# Patient Record
Sex: Female | Born: 1985 | Race: White | Hispanic: No | Marital: Single | Smoking: Never smoker
Health system: Southern US, Community
[De-identification: ages and names within clinical notes are randomized; demographics above are authoritative.]

## PROBLEM LIST (undated history)

## (undated) DIAGNOSIS — D249 Benign neoplasm of unspecified breast: Secondary | ICD-10-CM

## (undated) DIAGNOSIS — G540 Brachial plexus disorders: Secondary | ICD-10-CM

## (undated) DIAGNOSIS — G43909 Migraine, unspecified, not intractable, without status migrainosus: Secondary | ICD-10-CM

## (undated) DIAGNOSIS — K219 Gastro-esophageal reflux disease without esophagitis: Secondary | ICD-10-CM

## (undated) DIAGNOSIS — Z973 Presence of spectacles and contact lenses: Secondary | ICD-10-CM

## (undated) DIAGNOSIS — D259 Leiomyoma of uterus, unspecified: Secondary | ICD-10-CM

## (undated) DIAGNOSIS — R102 Pelvic and perineal pain: Secondary | ICD-10-CM

## (undated) DIAGNOSIS — H43811 Vitreous degeneration, right eye: Secondary | ICD-10-CM

## (undated) HISTORY — DX: Benign neoplasm of unspecified breast: D24.9

## (undated) HISTORY — DX: Gastro-esophageal reflux disease without esophagitis: K21.9

---
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
## 2014-05-26 ENCOUNTER — Other Ambulatory Visit: Payer: Self-pay | Admitting: Family Medicine

## 2014-05-26 ENCOUNTER — Ambulatory Visit
Admission: RE | Admit: 2014-05-26 | Discharge: 2014-05-26 | Disposition: A | Discharge status: Home / Self Care | Payer: BC Managed Care – PPO | Source: Ambulatory Visit | Attending: Family Medicine | Admitting: Family Medicine

## 2014-05-26 DIAGNOSIS — R0602 Shortness of breath: Secondary | ICD-10-CM

## 2014-11-10 DIAGNOSIS — H43811 Vitreous degeneration, right eye: Secondary | ICD-10-CM

## 2014-11-10 HISTORY — DX: Vitreous degeneration, right eye: H43.811

## 2015-11-11 HISTORY — PX: ESOPHAGOGASTRODUODENOSCOPY (EGD) WITH PROPOFOL: SHX5813

## 2015-11-20 ENCOUNTER — Other Ambulatory Visit: Payer: Self-pay | Admitting: Gastroenterology

## 2015-11-20 DIAGNOSIS — R1013 Epigastric pain: Secondary | ICD-10-CM

## 2015-11-27 ENCOUNTER — Ambulatory Visit
Admission: RE | Admit: 2015-11-27 | Discharge: 2015-11-27 | Disposition: A | Discharge status: Home / Self Care | Payer: BLUE CROSS/BLUE SHIELD | Source: Ambulatory Visit | Attending: Gastroenterology | Admitting: Gastroenterology

## 2015-11-27 ENCOUNTER — Encounter (INDEPENDENT_AMBULATORY_CARE_PROVIDER_SITE_OTHER): Payer: Self-pay

## 2015-11-27 DIAGNOSIS — R1013 Epigastric pain: Secondary | ICD-10-CM

## 2016-03-06 DIAGNOSIS — Z01419 Encounter for gynecological examination (general) (routine) without abnormal findings: Secondary | ICD-10-CM | POA: Diagnosis not present

## 2016-03-06 DIAGNOSIS — Z6822 Body mass index (BMI) 22.0-22.9, adult: Secondary | ICD-10-CM | POA: Diagnosis not present

## 2016-03-17 ENCOUNTER — Ambulatory Visit: Payer: BLUE CROSS/BLUE SHIELD | Admitting: Neurology

## 2016-03-25 ENCOUNTER — Encounter: Payer: Self-pay | Admitting: Neurology

## 2016-03-25 ENCOUNTER — Ambulatory Visit (INDEPENDENT_AMBULATORY_CARE_PROVIDER_SITE_OTHER): Payer: BLUE CROSS/BLUE SHIELD | Admitting: Neurology

## 2016-03-25 VITALS — BP 121/74 | HR 71 | Ht 62.0 in | Wt 130.6 lb

## 2016-03-25 DIAGNOSIS — G43009 Migraine without aura, not intractable, without status migrainosus: Secondary | ICD-10-CM

## 2016-03-25 MED ORDER — SUMATRIPTAN SUCCINATE 100 MG PO TABS: 100.0000 mg | ORAL_TABLET | Freq: Once | ORAL | Status: DC | PRN

## 2016-03-25 NOTE — Patient Instructions (Addendum)
Remember to drink plenty of fluid, eat healthy meals and do not skip any meals. Try to eat protein with a every meal and eat a healthy snack such as fruit or nuts in between meals. Try to keep a regular sleep-wake schedule and try to exercise daily, particularly in the form of walking, 20-30 minutes a day, if you can.   As far as your medications are concerned, I would like to suggest:  Imitrex: Please take one tablet at the onset of your headache. If it does not improve the symptoms please take one additional tablet in 2 hours. Do not take more then 2 tablets in 24hrs. Do not take use more then 2 to 3 times in a week.  As far as diagnostic testing: labs  Our phone number is [phone]. We also have an after hours call service for urgent matters and there is a physician on-call for urgent questions. For any emergencies you know to call 911 or go to the nearest emergency room

## 2016-03-25 NOTE — Progress Notes (Signed)
[facility]    Provider:  Dr Jaynee Eagles Referring Provider: Arvella Nigh, MD Primary Care Physician:  Vena Austria, MD  CC:  Headaches  HPI:  [name] is a 30 y.o. female here as a referral from Dr. Radene Knee for headaches. Past medical history of asthma, Headaches started for most of her life since high school. Worsening over the last 5-6 years. They are more intense, has to lay down. Usually around the eyes, mostly around the temples. Usually more unilateral then switches to the other side. Throbbing behind the eyes. Light sensitivity, shuts the blinds, lays down with an ice pack. +nausea but no vomiting. She has them on the weekends. 10 headache days a month.  2-3 are migraines. Migraines last the whole day. Gradually gets stronger over a few hours, can get to a 7/10 in pain at its worst on average 3-5/10. Last all day, up to 24 hours. No aura. No dizziness or vertigo or weakness or sensory changes. Mother, aunt, grandmother with migraines. No plans to get pregnant. No medication overuse. Takes Excedrin occ. Or Tylenol but no more than a few times a week. The non-migrainous headaches last just a few hours and they are dull and not too painful.  No blurry vision. No aura. No family history of headaches.  Reviewed notes, labs and imaging from outside physicians, which showed: Reviewed notes from OB/GYN. She was started on birth control pills for management of abnormal bleeding, her regulars were regular but she was having midcycle spotting. No significant dysmenorrhea. Reported she had problems on and up with headaches for some time that it never been evaluated. Not sexually active. No urinary or bowel complaints. Exam was negative, thyroid nonpalpable, breasts normal, lungs clear, regular rate and rhythm without murmurs or gallop, no carotid or abdominal bruits, abdominal exam benign, no masses or organomegaly or tenderness, normal external genitalia, cervix unremarkable,  uterus and adnexa normal.  Review of Systems: Patient complains of symptoms per HPI as well as the following symptoms: No reported cardiac problems, no chest pain or shortness of breath, no fevers or systemic signs. Pertinent negatives per HPI. All others negative.   Social History   Social History  . Marital Status: Single    Spouse Name: N/A  . Number of Children: N/A  . Years of Education: N/A   Occupational History  . Not on file.   Social History Main Topics  . Smoking status: Not on file  . Smokeless tobacco: Not on file  . Alcohol Use: Not on file  . Drug Use: Not on file  . Sexual Activity: Not on file   Other Topics Concern  . Not on file   Social History Narrative  . No narrative on file    No family history on file.  No past medical history on file.  No past surgical history on file.  Current Outpatient Prescriptions  Medication Sig Dispense Refill  . CRYSELLE-28 0.3-30 MG-MCG tablet Take 1 tablet by mouth daily.  0  . omeprazole (PRILOSEC) 20 MG capsule Take 20 mg by mouth 2 (two) times daily.  12   No current facility-administered medications for this visit.    Allergies as of 03/25/2016 - Review Complete 03/25/2016  Allergen Reaction Noted  . Meloxicam  03/25/2016  . Sulfa antibiotics Hives 03/25/2016    Vitals: BP 121/74 mmHg  Pulse 71  Ht 5\' 2"  (1.575 m)  Wt 130 lb 9.6 oz (59.24 kg)  BMI 23.88 kg/m2 Last Weight:  Wt Readings  from Last 1 Encounters:  03/25/16 130 lb 9.6 oz (59.24 kg)   Last Height:   Ht Readings from Last 1 Encounters:  03/25/16 5\' 2"  (1.575 m)   Physical exam: Exam: Gen: NAD, conversant, well nourised,  well groomed                     CV: RRR, no MRG. No Carotid Bruits. No peripheral edema, warm, nontender Eyes: Conjunctivae clear without exudates or hemorrhage  Neuro: Detailed Neurologic Exam  Speech:    Speech is normal; fluent and spontaneous with normal comprehension.  Cognition:    The patient is  oriented to person, place, and time;     recent and remote memory intact;     language fluent;     normal attention, concentration,     fund of knowledge Cranial Nerves:    The pupils are equal, round, and reactive to light. The fundi are normal and spontaneous venous pulsations are present. Visual fields are full to finger confrontation. Extraocular movements are intact. Trigeminal sensation is intact and the muscles of mastication are normal. The face is symmetric. The palate elevates in the midline. Hearing intact. Voice is normal. Shoulder shrug is normal. The tongue has normal motion without fasciculations.   Coordination:    Normal finger to nose and heel to shin. Normal rapid alternating movements.   Gait:    Heel-toe and tandem gait are normal.   Motor Observation:    No asymmetry, no atrophy, and no involuntary movements noted. Tone:    Normal muscle tone.    Posture:    Posture is normal. normal erect    Strength:    Strength is V/V in the upper and lower limbs.      Sensation: intact to LT     Reflex Exam:  DTR's:    Deep tendon reflexes in the upper and lower extremities are normal bilaterally.   Toes:    The toes are downgoing bilaterally.   Clonus:    Clonus is absent.       Assessment/Plan:  30 year old female with migraines without aura without status migrainosus not intractable. Discussed preventative and acute management of migraines. Discussed lifestyle adjustments and watching for triggers, keeping a headache diary. Patient does not want to go on preventative medication at this time, can continue with just acute management.  As far as your medications are concerned, I would like to suggest:  Imitrex: Please take one tablet at the onset of your headache. If it does not improve the symptoms please take one additional tablet in 2 hours. Do not take more then 2 tablets in 24hrs. Do not take use more then 2 to 3 times in a week.The most common side-effects  are feeling sick (nausea), dizziness and dry mouth. In addition, triptans can also cause some people to experience strange sensations. These may be a tightness, tingling, flushing, and feelings of heaviness or pressure in areas such as the face and limbs, and occasionally the chest. Serious side effects can include stroke, cardiac side effects such as chest tightness, shortness of breath and possible cardiovascular adverse effects. Do not take his medication if you are pregnant.  As far as diagnostic testing: CBC and CMP.  To prevent or relieve headaches, try the following: Cool Compress. Lie down and place a cool compress on your head.  Avoid headache triggers. If certain foods or odors seem to have triggered your migraines in the past, avoid them. A headache diary might  help you identify triggers.  Include physical activity in your daily routine. Try a daily walk or other moderate aerobic exercise.  Manage stress. Find healthy ways to cope with the stressors, such as delegating tasks on your to-do list.  Practice relaxation techniques. Try deep breathing, yoga, massage and visualization.  Eat regularly. Eating regularly scheduled meals and maintaining a healthy diet might help prevent headaches. Also, drink plenty of fluids.  Follow a regular sleep schedule. Sleep deprivation might contribute to headaches Consider biofeedback. With this mind-body technique, you learn to control certain bodily functions - such as muscle tension, heart rate and blood pressure - to prevent headaches or reduce headache pain.    Proceed to emergency room if you experience new or worsening symptoms or symptoms do not resolve, if you have new neurologic symptoms or if headache is severe, or for any concerning symptom.       Sarina Ill, MD  [facility] [address] [place], [place] [address]  Phone [phone] Fax [phone]

## 2016-03-26 ENCOUNTER — Telehealth: Payer: Self-pay | Admitting: *Deleted

## 2016-03-26 DIAGNOSIS — G43909 Migraine, unspecified, not intractable, without status migrainosus: Secondary | ICD-10-CM | POA: Insufficient documentation

## 2016-03-26 LAB — CBC
Hematocrit: 36.5 % (ref 34.0–46.6)
Hemoglobin: 12.2 g/dL (ref 11.1–15.9)
MCH: 27.5 pg (ref 26.6–33.0)
MCHC: 33.4 g/dL (ref 31.5–35.7)
MCV: 82 fL (ref 79–97)
PLATELETS: 277 10*3/uL (ref 150–379)
RBC: 4.43 x10E6/uL (ref 3.77–5.28)
RDW: 14 % (ref 12.3–15.4)
WBC: 3.6 10*3/uL (ref 3.4–10.8)

## 2016-03-26 LAB — COMPREHENSIVE METABOLIC PANEL
ALK PHOS: 52 IU/L (ref 39–117)
ALT: 11 IU/L (ref 0–32)
AST: 14 IU/L (ref 0–40)
Albumin/Globulin Ratio: 1.4 (ref 1.2–2.2)
Albumin: 4.2 g/dL (ref 3.5–5.5)
BILIRUBIN TOTAL: 0.6 mg/dL (ref 0.0–1.2)
BUN/Creatinine Ratio: 15 (ref 9–23)
BUN: 8 mg/dL (ref 6–20)
CO2: 21 mmol/L (ref 18–29)
Calcium: 9.4 mg/dL (ref 8.7–10.2)
Chloride: 103 mmol/L (ref 96–106)
Creatinine, Ser: 0.53 mg/dL — ABNORMAL LOW (ref 0.57–1.00)
GFR calc Af Amer: 147 mL/min/{1.73_m2} (ref >59)
GFR calc non Af Amer: 128 mL/min/{1.73_m2} (ref >59)
GLOBULIN, TOTAL: 3 g/dL (ref 1.5–4.5)
Glucose: 98 mg/dL (ref 65–99)
POTASSIUM: 5 mmol/L (ref 3.5–5.2)
SODIUM: 140 mmol/L (ref 134–144)
Total Protein: 7.2 g/dL (ref 6.0–8.5)

## 2016-03-26 NOTE — Telephone Encounter (Signed)
LVM about normal labs per Dr Jaynee Eagles. Gave [facility] phone number if she has further questions.

## 2016-03-26 NOTE — Telephone Encounter (Signed)
-----   Message from Melvenia Beam, MD sent at 03/26/2016 12:10 PM EDT ----- Labs normal

## 2016-05-26 DIAGNOSIS — G43909 Migraine, unspecified, not intractable, without status migrainosus: Secondary | ICD-10-CM | POA: Diagnosis not present

## 2016-05-27 ENCOUNTER — Encounter: Payer: Self-pay | Admitting: Neurology

## 2016-06-05 DIAGNOSIS — K219 Gastro-esophageal reflux disease without esophagitis: Secondary | ICD-10-CM | POA: Diagnosis not present

## 2016-07-09 ENCOUNTER — Encounter: Payer: Self-pay | Admitting: Neurology

## 2016-07-09 ENCOUNTER — Telehealth: Payer: Self-pay | Admitting: Neurology

## 2016-07-09 ENCOUNTER — Encounter: Payer: Self-pay | Admitting: *Deleted

## 2016-07-09 ENCOUNTER — Ambulatory Visit (INDEPENDENT_AMBULATORY_CARE_PROVIDER_SITE_OTHER): Payer: BLUE CROSS/BLUE SHIELD | Admitting: Neurology

## 2016-07-09 VITALS — BP 132/67 | HR 101 | Ht 62.0 in | Wt 132.0 lb

## 2016-07-09 DIAGNOSIS — G43009 Migraine without aura, not intractable, without status migrainosus: Secondary | ICD-10-CM | POA: Diagnosis not present

## 2016-07-09 DIAGNOSIS — R110 Nausea: Secondary | ICD-10-CM | POA: Diagnosis not present

## 2016-07-09 MED ORDER — ELETRIPTAN HYDROBROMIDE 40 MG PO TABS: 40.0000 mg | ORAL_TABLET | ORAL | 12 refills | Status: DC | PRN

## 2016-07-09 MED ORDER — DICLOFENAC POTASSIUM(MIGRAINE) 50 MG PO PACK: 50.0000 mg | PACK | Freq: Once | ORAL | 30 refills | Status: DC | PRN

## 2016-07-09 MED ORDER — NORTRIPTYLINE HCL 10 MG PO CAPS: 20.0000 mg | ORAL_CAPSULE | Freq: Every day | ORAL | 12 refills | Status: DC

## 2016-07-09 MED ORDER — ONDANSETRON 4 MG PO TBDP: ORAL_TABLET | ORAL | 12 refills | Status: DC

## 2016-07-09 NOTE — Progress Notes (Signed)
Faxed [facility] enrollment form to [place]. Fax: [phone]. Received confirmation.

## 2016-07-09 NOTE — Patient Instructions (Addendum)
Remember to drink plenty of fluid, eat healthy meals and do not skip any meals. Try to eat protein with a every meal and eat a healthy snack such as fruit or nuts in between meals. Try to keep a regular sleep-wake schedule and try to exercise daily, particularly in the form of walking, 20-30 minutes a day, if you can.   As far as your medications are concerned, I would like to suggest: Nortriptyline 10mg  at night may increase to 20mg  in 4-6 weeks Relpax: Please take one tablet at the onset of your headache. If it does not improve the symptoms please take one additional tablet in 2 hours. Do not take more then 2 tablets in 24hrs. Do not take use more then 2 to 3 days in a week. May take with Cambia or Zofran.  I would like to see you back in 4 months, sooner if we need to. Please call us with any interim questions, concerns, problems, updates or refill requests.    Our phone number is [phone]. We also have an after hours call service for urgent matters and there is a physician on-call for urgent questions. For any emergencies you know to call 911 or go to the nearest emergency room  Nortriptyline capsules What is this medicine? NORTRIPTYLINE (nor TRIP ti leen) is used to treat depression. This medicine may be used for other purposes; ask your health care provider or pharmacist if you have questions. What should I tell my health care provider before I take this medicine? They need to know if you have any of these conditions: -an alcohol problem -bipolar disorder or schizophrenia -difficulty passing urine, prostate trouble -glaucoma -heart disease or recent heart attack -liver disease -over active thyroid -seizures -thoughts or plans of suicide or a previous suicide attempt or family history of suicide attempt -an unusual or allergic reaction to nortriptyline, other medicines, foods, dyes, or preservatives -pregnant or trying to get pregnant -breast-feeding How should I use this  medicine? Take this medicine by mouth with a glass of water. Follow the directions on the prescription label. Take your doses at regular intervals. Do not take it more often than directed. Do not stop taking this medicine suddenly except upon the advice of your doctor. Stopping this medicine too quickly may cause serious side effects or your condition may worsen. A special MedGuide will be given to you by the pharmacist with each prescription and refill. Be sure to read this information carefully each time. Talk to your pediatrician regarding the use of this medicine in children. Special care may be needed. Overdosage: If you think you have taken too much of this medicine contact a poison control center or emergency room at once. NOTE: This medicine is only for you. Do not share this medicine with others. What if I miss a dose? If you miss a dose, take it as soon as you can. If it is almost time for your next dose, take only that dose. Do not take double or extra doses. What may interact with this medicine? Do not take this medicine with any of the following medications: -arsenic trioxide -certain medicines medicines for irregular heart beat -cisapride -halofantrine -linezolid -MAOIs like Carbex, Eldepryl, Marplan, Nardil, and Parnate -methylene blue (injected into a vein) -other medicines for mental depression -phenothiazines like perphenazine, thioridazine and chlorpromazine -pimozide -probucol -procarbazine -sparfloxacin -St. John's Wort -ziprasidone This medicine may also interact with any of the following medications: -atropine and related drugs like hyoscyamine, scopolamine, tolterodine and others -barbiturate  medicines for inducing sleep or treating seizures, such as phenobarbital -cimetidine -medicines for diabetes -medicines for seizures like carbamazepine or phenytoin -reserpine -thyroid medicine This list may not describe all possible interactions. Give your health care  provider a list of all the medicines, herbs, non-prescription drugs, or dietary supplements you use. Also tell them if you smoke, drink alcohol, or use illegal drugs. Some items may interact with your medicine. What should I watch for while using this medicine? Tell your doctor if your symptoms do not get better or if they get worse. Visit your doctor or health care professional for regular checks on your progress. Because it may take several weeks to see the full effects of this medicine, it is important to continue your treatment as prescribed by your doctor. Patients and their families should watch out for new or worsening thoughts of suicide or depression. Also watch out for sudden changes in feelings such as feeling anxious, agitated, panicky, irritable, hostile, aggressive, impulsive, severely restless, overly excited and hyperactive, or not being able to sleep. If this happens, especially at the beginning of treatment or after a change in dose, call your health care professional. Dennis Bast may get drowsy or dizzy. Do not drive, use machinery, or do anything that needs mental alertness until you know how this medicine affects you. Do not stand or sit up quickly, especially if you are an older patient. This reduces the risk of dizzy or fainting spells. Alcohol may interfere with the effect of this medicine. Avoid alcoholic drinks. Do not treat yourself for coughs, colds, or allergies without asking your doctor or health care professional for advice. Some ingredients can increase possible side effects. Your mouth may get dry. Chewing sugarless gum or sucking hard candy, and drinking plenty of water may help. Contact your doctor if the problem does not go away or is severe. This medicine may cause dry eyes and blurred vision. If you wear contact lenses you may feel some discomfort. Lubricating drops may help. See your eye doctor if the problem does not go away or is severe. This medicine can cause constipation.  Try to have a bowel movement at least every 2 to 3 days. If you do not have a bowel movement for 3 days, call your doctor or health care professional. This medicine can make you more sensitive to the sun. Keep out of the sun. If you cannot avoid being in the sun, wear protective clothing and use sunscreen. Do not use sun lamps or tanning beds/booths. What side effects may I notice from receiving this medicine? Side effects that you should report to your doctor or health care professional as soon as possible: -allergic reactions like skin rash, itching or hives, swelling of the face, lips, or tongue -abnormal production of milk in females -breast enlargement in both males and females -breathing problems -confusion, hallucinations -fever with increased sweating -irregular or fast, pounding heartbeat -muscle stiffness, or spasms -pain or difficulty passing urine, loss of bladder control -seizures -suicidal thoughts or other mood changes -swelling of the testicles -tingling, pain, or numbness in the feet or hands -yellowing of the eyes or skin Side effects that usually do not require medical attention (report to your doctor or health care professional if they continue or are bothersome): -change in sex drive or performance -diarrhea -nausea, vomiting -weight gain or loss This list may not describe all possible side effects. Call your doctor for medical advice about side effects. You may report side effects to FDA at 1-800-FDA-1088.  Where should I keep my medicine? Keep out of the reach of children. Store at room temperature between 15 and 30 degrees C (59 and 86 degrees F). Keep container tightly closed. Throw away any unused medicine after the expiration date. NOTE: This sheet is a summary. It may not cover all possible information. If you have questions about this medicine, talk to your doctor, pharmacist, or health care provider.    2016, [employer] Standard. (2012-03-15  13:57:12)  Diclofenac powder for oral solution What is this medicine? DICLOFENAC (dye KLOE fen ak) is a non-steroidal anti-inflammatory drug (NSAID). It is used to treat migraine pain. This medicine may be used for other purposes; ask your health care provider or pharmacist if you have questions. What should I tell my health care provider before I take this medicine? They need to know if you have any of these conditions: -asthma, especially aspirin sensitive asthma -coronary artery bypass graft (CABG) surgery within the past 2 weeks -drink more than 3 alcohol-containing drinks a day -heart disease or circulation problems like heart failure or leg edema (fluid retention) -high blood pressure -kidney disease -liver disease -phenylketonuria -stomach problems -an unusual or allergic reaction to diclofenac, aspirin, other NSAIDs, other medicines, foods, dyes, or preservatives -pregnant or trying to get pregnant -breast-feeding How should I use this medicine? Mix this medicine with 1 to 2 ounces of water. Drink the medicine and water together. Follow the directions on the prescription label. Do not take your medicine more often than directed. Long-term, continuous use may increase the risk of heart attack or stroke. A special MedGuide will be given to you by the pharmacist with each prescription and refill. Be sure to read this information carefully each time. Talk to your pediatrician regarding the use of this medicine in children. Special care may be needed. Elderly patients over 8 years old may have a stronger reaction and need a smaller dose. Overdosage: If you think you have taken too much of this medicine contact a poison control center or emergency room at once. NOTE: This medicine is only for you. Do not share this medicine with others. What if I miss a dose? This does not apply. What may interact with this medicine? Do not take this medicine with any of the following  medications: -cidofovir -ketorolac -methotrexate This medicine may also interact with the following medications: -alcohol -aspirin and aspirin-like medicines -cyclosporine -diuretics -lithium -medicines for blood pressure -medicines for osteoporosis -medicines that affect platelets -medicines that treat or prevent blood clots like warfarin -NSAIDs, medicines for pain and inflammation, like ibuprofen or naproxen -pemetrexed -steroid medicines like prednisone or cortisone This list may not describe all possible interactions. Give your health care provider a list of all the medicines, herbs, non-prescription drugs, or dietary supplements you use. Also tell them if you smoke, drink alcohol, or use illegal drugs. Some items may interact with your medicine. What should I watch for while using this medicine? Tell your doctor or health care professional if your pain does not get better. Talk to your doctor before taking another medicine for pain. Do not treat yourself. This medicine does not prevent heart attack or stroke. In fact, this medicine may increase the chance of a heart attack or stroke. The chance may increase with longer use of this medicine and in people who have heart disease. If you take aspirin to prevent heart attack or stroke, talk with your doctor or health care professional. Do not take medicines such as ibuprofen and naproxen with  this medicine. Side effects such as stomach upset, nausea, or ulcers may be more likely to occur. Many medicines available without a prescription should not be taken with this medicine. This medicine can cause ulcers and bleeding in the stomach and intestines at any time during treatment. Do not smoke cigarettes or drink alcohol. These increase irritation to your stomach and can make it more susceptible to damage from this medicine. Ulcers and bleeding can happen without warning symptoms and can cause death. You may get drowsy or dizzy. Do not drive, use  machinery, or do anything that needs mental alertness until you know how this medicine affects you. Do not stand or sit up quickly, especially if you are an older patient. This reduces the risk of dizzy or fainting spells. This medicine can cause you to bleed more easily. Try to avoid damage to your teeth and gums when you brush or floss your teeth. If you take migraine medicines for 10 or more days a month, your migraines may get worse. Keep a diary of headache days and medicine use. Contact your healthcare professional if your migraine attacks occur more frequently. What side effects may I notice from receiving this medicine? Side effects that you should report to your doctor or health care professional as soon as possible: -allergic reactions like skin rash, itching or hives, swelling of the face, lips, or tongue -black or bloody stools, blood in the urine or vomit -blurred vision -chest pain -difficulty breathing or wheezing -nausea or vomiting -fever -redness, blistering, peeling or loosening of the skin, including inside the mouth -slurred speech or weakness on one side of the body -trouble passing urine or change in the amount of urine -unexplained weight gain or swelling -unusually weak or tired -yellowing of eyes or skin Side effects that usually do not require medical attention (Report these to your doctor or health care professional if they continue or are bothersome.): -constipation -diarrhea -dizziness -headache -heartburn This list may not describe all possible side effects. Call your doctor for medical advice about side effects. You may report side effects to FDA at 1-800-FDA-1088. Where should I keep my medicine? Keep out of the reach of children. Store at room temperature between 15 and 30 degrees C (59 and 86 degrees F). Throw away any unused medicine after the expiration date. NOTE: This sheet is a summary. It may not cover all possible information. If you have  questions about this medicine, talk to your doctor, pharmacist, or health care provider.    2016, [employer]/Gold Standard. (2013-06-28 10:55:34)  Ondansetron oral dissolving tablet What is this medicine? ONDANSETRON (on DAN se tron) is used to treat nausea and vomiting caused by chemotherapy. It is also used to prevent or treat nausea and vomiting after surgery. This medicine may be used for other purposes; ask your health care provider or pharmacist if you have questions. What should I tell my health care provider before I take this medicine? They need to know if you have any of these conditions: -heart disease -history of irregular heartbeat -liver disease -low levels of magnesium or potassium in the blood -an unusual or allergic reaction to ondansetron, granisetron, other medicines, foods, dyes, or preservatives -pregnant or trying to get pregnant -breast-feeding How should I use this medicine? These tablets are made to dissolve in the mouth. Do not try to push the tablet through the foil backing. With dry hands, peel away the foil backing and gently remove the tablet. Place the tablet in the mouth and  allow it to dissolve, then swallow. While you may take these tablets with water, it is not necessary to do so. Talk to your pediatrician regarding the use of this medicine in children. Special care may be needed. Overdosage: If you think you have taken too much of this medicine contact a poison control center or emergency room at once. NOTE: This medicine is only for you. Do not share this medicine with others. What if I miss a dose? If you miss a dose, take it as soon as you can. If it is almost time for your next dose, take only that dose. Do not take double or extra doses. What may interact with this medicine? Do not take this medicine with any of the following medications: -apomorphine -certain medicines for fungal infections like fluconazole, itraconazole, ketoconazole, posaconazole,  voriconazole -cisapride -dofetilide -dronedarone -pimozide -thioridazine -ziprasidone This medicine may also interact with the following medications: -carbamazepine -certain medicines for depression, anxiety, or psychotic disturbances -fentanyl -linezolid -MAOIs like Carbex, Eldepryl, Marplan, Nardil, and Parnate -methylene blue (injected into a vein) -other medicines that prolong the QT interval (cause an abnormal heart rhythm) -phenytoin -rifampicin -tramadol This list may not describe all possible interactions. Give your health care provider a list of all the medicines, herbs, non-prescription drugs, or dietary supplements you use. Also tell them if you smoke, drink alcohol, or use illegal drugs. Some items may interact with your medicine. What should I watch for while using this medicine? Check with your doctor or health care professional as soon as you can if you have any sign of an allergic reaction. What side effects may I notice from receiving this medicine? Side effects that you should report to your doctor or health care professional as soon as possible: -allergic reactions like skin rash, itching or hives, swelling of the face, lips, or tongue -breathing problems -confusion -dizziness -fast or irregular heartbeat -feeling faint or lightheaded, falls -fever and chills -loss of balance or coordination -seizures -sweating -swelling of the hands and feet -tightness in the chest -tremors -unusually weak or tired Side effects that usually do not require medical attention (report to your doctor or health care professional if they continue or are bothersome): -constipation or diarrhea -headache This list may not describe all possible side effects. Call your doctor for medical advice about side effects. You may report side effects to FDA at 1-800-FDA-1088. Where should I keep my medicine? Keep out of the reach of children. Store between 2 and 30 degrees C (36 and 86  degrees F). Throw away any unused medicine after the expiration date. NOTE: This sheet is a summary. It may not cover all possible information. If you have questions about this medicine, talk to your doctor, pharmacist, or health care provider.    2016, [employer] Standard. (2013-08-03 16:21:52)   Eletriptan tablets What is this medicine? ELETRIPTAN (el ih TRIP tan) is used to treat migraines with or without aura. An aura is a strange feeling or visual disturbance that warns you of an attack. It is not used to prevent migraines. This medicine may be used for other purposes; ask your health care provider or pharmacist if you have questions. What should I tell my health care provider before I take this medicine? They need to know if you have any of these conditions: -bowel disease or colitis -diabetes -family history of heart disease -fast or irregular heart beat -heart or blood vessel disease, angina (chest pain), or previous heart attack -high blood pressure -high cholesterol -history  of stroke, transient ischemic attacks (TIAs or mini-strokes), or intracranial bleeding -kidney or liver disease -overweight -poor circulation -postmenopausal or surgical removal of uterus and ovaries -Raynaud's disease -seizure disorder -an unusual or allergic reaction to eletriptan, other medicines, foods, dyes, or preservatives -pregnant or trying to get pregnant -breast-feeding How should I use this medicine? Take this medicine by mouth with a glass of water. Follow the directions on the prescription label. This medicine is taken at the first symptoms of a migraine. It is not for everyday use. If your migraine headache returns after one dose, you can take another dose as directed. You must leave at least 2 hours between doses, and do not take more than 40 mg as a single dose. Do not take more than 80 mg total in any 24 hour period. If there is no improvement at all after the first dose, do not take  a second dose without talking to your doctor or health care professional. Do not take your medicine more often than directed. Talk to your pediatrician regarding the use of this medicine in children. Special care may be needed. Overdosage: If you think you have taken too much of this medicine contact a poison control center or emergency room at once. NOTE: This medicine is only for you. Do not share this medicine with others. What if I miss a dose? This does not apply; this medicine is not for regular use. What may interact with this medicine? Do not take this medicine with any of the following medications: -amiodarone -amphetamine, dextroamphetamine, or cocaine -aprepitant -certain antibiotics like clarithromycin, erythromycin, troleandomycin -cimetidine -conivaptan -dalfopristin; quinupristin -dihydroergotamine, ergotamine, ergoloid mesylates, methysergide, or ergot-type medication - do not take within 24 hours of taking eletriptan. -diltiazem -feverfew -imatinib -medicines for fungal infections like fluconazole, itraconazole, ketoconazole, and voriconazole -medicines for HIV, AIDS -medicines for mental depression like fluvoxamine and nefazodone -mifepristone -other migraine medicines like almotriptan, sumatriptan, naratriptan, rizatriptan, zolmitriptan - do not take within 24 hours of taking eletriptan. -tryptophan -verapamil This medicine may also interact with the following medications: -medicines for mental depression, anxiety or mood problems This list may not describe all possible interactions. Give your health care provider a list of all the medicines, herbs, non-prescription drugs, or dietary supplements you use. Also tell them if you smoke, drink alcohol, or use illegal drugs. Some items may interact with your medicine. What should I watch for while using this medicine? Only take this medicine for a migraine headache. Take it if you get warning symptoms or at the start of a  migraine attack. It is not for regular use to prevent migraine attacks. You may get drowsy or dizzy. Do not drive, use machinery, or do anything that needs mental alertness until you know how this medicine affects you. To reduce dizzy or fainting spells, do not sit or stand up quickly, especially if you are an older patient. Alcohol can increase drowsiness, dizziness and flushing. Avoid alcoholic drinks. Smoking cigarettes may increase the risk of heart-related side effects from using this medicine. If you take migraine medicines for 10 or more days a month, your migraines may get worse. Keep a diary of headache days and medicine use. Contact your healthcare professional if your migraine attacks occur more frequently. What side effects may I notice from receiving this medicine? Side effects that you should report to your doctor or health care professional as soon as possible: -allergic reactions like skin rash, itching or hives, swelling of the face, lips, or tongue -fast, slow,  or irregular heart beat -increased or decreased blood pressure -seizures -severe stomach pain and cramping, bloody diarrhea -signs and symptoms of a blood clot such as breathing problems; changes in vision; chest pain; severe, sudden headache; pain, swelling, warmth in the leg; trouble speaking; sudden numbness or weakness of the face, arm or leg -tingling, pain, or numbness in the face, hands, or feet Side effects that usually do not require medical attention (report to your doctor or health care professional if they continue or are bothersome): -drowsiness -feeling warm, flushing, or redness of the face -headache -muscle cramps, pain -nausea, vomiting -unusually weak or tired This list may not describe all possible side effects. Call your doctor for medical advice about side effects. You may report side effects to FDA at 1-800-FDA-1088. Where should I keep my medicine? Keep out of the reach of children. Store at room  temperature between 15 and 30 degrees C (59 and 86 degrees F). Throw away any unused medicine after the expiration date. NOTE: This sheet is a summary. It may not cover all possible information. If you have questions about this medicine, talk to your doctor, pharmacist, or health care provider.    2016, [employer] Standard. (2013-06-28 10:22:32)

## 2016-07-09 NOTE — Telephone Encounter (Signed)
FYI-[name] with [employer] is calling to advise that she will get started on the authorization for Cambia for the patient and submitting to insurance today.

## 2016-07-09 NOTE — Progress Notes (Signed)
[facility]   Provider:  Dr Jaynee Eagles Referring Provider: Arvella Nigh, MD Primary Care Physician:  Vena Austria, MD  CC:  Headaches  Interval history 07/09/2016: Imitrex not entirely working and  Makes neck stiff, will try Relpax next. Weekends are worse. In June had 4 headaches days ranging from 4-6/10 in pain. In July she had 4 mild headaches 2-4/10, moderate 2 6-8/10 and had one very severe. They slowly progress but can get bad. She has had 4 in August one 7/10. She feels dehydration makes them worse. She has difficulty sleeping throughout the night. She has tried imitrex and ibuprofen and tylenol. She has a lot nausea and pills make her sick and difficulty swallowing. Discussed starting a daily preventative and also options for acute management. We'll start nortriptyline at night for prevention. We'll provide Relpax, Zofran and can be for acute management. Patient is to see me back in 4 months.   HPI:  [name] is a 30 y.o. female here as a referral from Dr. Radene Knee for headaches. Past medical history of asthma, Headaches started for most of her life since high school. Worsening over the last 5-6 years. They are more intense, has to lay down. Usually around the eyes, mostly around the temples. Usually more unilateral then switches to the other side. Throbbing behind the eyes. Light sensitivity, shuts the blinds, lays down with an ice pack. +nausea but no vomiting. She has them on the weekends. 10 headache days a month.  2-3 are migraines. Migraines last the whole day. Gradually gets stronger over a few hours, can get to a 7/10 in pain at its worst on average 3-5/10. Last all day, up to 24 hours. No aura. No dizziness or vertigo or weakness or sensory changes. Mother, aunt, grandmother with migraines. No plans to get pregnant. No medication overuse. Takes Excedrin occ. Or Tylenol but no more than a few times a week. The non-migrainous headaches last just a few hours  and they are dull and not too painful.  No blurry vision. No aura. No family history of headaches.  Reviewed notes, labs and imaging from outside physicians, which showed: Reviewed notes from OB/GYN. She was started on birth control pills for management of abnormal bleeding, her regulars were regular but she was having midcycle spotting. No significant dysmenorrhea. Reported she had problems on and up with headaches for some time that it never been evaluated. Not sexually active. No urinary or bowel complaints. Exam was negative, thyroid nonpalpable, breasts normal, lungs clear, regular rate and rhythm without murmurs or gallop, no carotid or abdominal bruits, abdominal exam benign, no masses or organomegaly or tenderness, normal external genitalia, cervix unremarkable, uterus and adnexa normal.  Review of Systems: Patient complains of symptoms per HPI as well as the following symptoms: No reported cardiac problems, no chest pain or shortness of breath, no fevers or systemic signs. Pertinent negatives per HPI. All others negative.     Social History   Social History  . Marital status: Single    Spouse name: N/A  . Number of children: 0  . Years of education: 50   Occupational History  . [employer]    Social History Main Topics  . Smoking status: Never Smoker  . Smokeless tobacco: Never Used  . Alcohol use No  . Drug use: No  . Sexual activity: Not on file   Other Topics Concern  . Not on file   Social History Narrative   Lives with mother and stepfather  Caffeine use: 1 coffee/day   1 soda per day    Family History  Problem Relation Age of Onset  . High blood pressure Mother   . High blood pressure Father   . Diabetes Mellitus II Maternal Grandmother   . Diabetes Mellitus I Maternal Grandfather   . Lung cancer Maternal Grandfather   . Stroke Paternal Grandfather   . Stroke Paternal Grandmother     Past Medical History:  Diagnosis Date  . GERD  (gastroesophageal reflux disease)     Past Surgical History:  Procedure Laterality Date  . WISDOM TOOTH EXTRACTION  2004   x4    Current Outpatient Prescriptions  Medication Sig Dispense Refill  . CRYSELLE-28 0.3-30 MG-MCG tablet Take 1 tablet by mouth daily.  0  . doxycycline (VIBRAMYCIN) 100 MG capsule Take 100 mg by mouth 2 (two) times daily as needed.  1  . omeprazole (PRILOSEC) 20 MG capsule Take 20 mg by mouth 2 (two) times daily.  12  . PROAIR HFA 108 (90 Base) MCG/ACT inhaler Take 2 puffs by mouth as needed.  2  . promethazine (PHENERGAN) 25 MG tablet Take 25 mg by mouth every 6 (six) hours as needed for nausea or vomiting.    . SUMAtriptan (IMITREX) 100 MG tablet Take 1 tablet (100 mg total) by mouth once as needed for migraine. May repeat in 2 hours if headache persists or recurs. 10 tablet 11   No current facility-administered medications for this visit.     Allergies as of 07/09/2016 - Review Complete 03/25/2016  Allergen Reaction Noted  . Meloxicam  03/25/2016  . Sulfa antibiotics Hives 03/25/2016    Vitals: BP 132/67 (BP Location: Right Arm, Patient Position: Sitting, Cuff Size: Normal)   Pulse (!) 101   Ht 5\' 2"  (1.575 m)   Wt 132 lb (59.9 kg)   BMI 24.14 kg/m  Last Weight:  Wt Readings from Last 1 Encounters:  07/09/16 132 lb (59.9 kg)   Last Height:   Ht Readings from Last 1 Encounters:  07/09/16 5\' 2"  (1.575 m)   Physical exam: Exam: Gen: NAD, conversant, well nourised,  well groomed                     CV: RRR, no MRG. No Carotid Bruits. No peripheral edema, warm, nontender Eyes: Conjunctivae clear without exudates or hemorrhage  Neuro: Detailed Neurologic Exam  Speech:    Speech is normal; fluent and spontaneous with normal comprehension.  Cognition:    The patient is oriented to person, place, and time;     recent and remote memory intact;     language fluent;     normal attention, concentration,     fund of knowledge Cranial Nerves:     The pupils are equal, round, and reactive to light. The fundi are normal and spontaneous venous pulsations are present. Visual fields are full to finger confrontation. Extraocular movements are intact. Trigeminal sensation is intact and the muscles of mastication are normal. The face is symmetric. The palate elevates in the midline. Hearing intact. Voice is normal. Shoulder shrug is normal. The tongue has normal motion without fasciculations.   Coordination:    Normal finger to nose and heel to shin. Normal rapid alternating movements.   Gait:    Heel-toe and tandem gait are normal.   Motor Observation:    No asymmetry, no atrophy, and no involuntary movements noted. Tone:    Normal muscle tone.    Posture:  Posture is normal. normal erect    Strength:    Strength is V/V in the upper and lower limbs.      Sensation: intact to LT     Reflex Exam:  DTR's:    Deep tendon reflexes in the upper and lower extremities are normal bilaterally.   Toes:    The toes are downgoing bilaterally.   Clonus:    Clonus is absent.       Assessment/Plan:  30 year old female with migraines without aura without status migrainosus not intractable. Discussed preventative and acute management of migraines. Discussed lifestyle adjustments and watching for triggers, keeping a headache diary. Patient does not want to go on preventative medication at this time, can continue with just acute management.  As far as your medications are concerned, I would like to suggest:  Relpax: Please take one tablet at the onset of your headache. If it does not improve the symptoms please take one additional tablet in 2 hours. Do not take more then 2 tablets in 24hrs. Do not take use more then 2 to 3 days in a week.The most common side-effects are feeling sick (nausea), dizziness and dry mouth. In addition, triptans can also cause some people to experience strange sensations. These may be a tightness,  tingling, flushing, and feelings of heaviness or pressure in areas such as the face and limbs, and occasionally the chest. Serious side effects can include stroke, cardiac side effects such as chest tightness, shortness of breath and possible cardiovascular adverse effects. Do not take his medication if you are pregnant.  May take with zofran and [place] Nortriptyline at night  Discussed side effects as per patient instructions. Do not Pregnant his medications as they can cause birth defects.  To prevent or relieve headaches, try the following: Cool Compress. Lie down and place a cool compress on your head.  Avoid headache triggers. If certain foods or odors seem to have triggered your migraines in the past, avoid them. A headache diary might help you identify triggers.  Include physical activity in your daily routine. Try a daily walk or other moderate aerobic exercise.  Manage stress. Find healthy ways to cope with the stressors, such as delegating tasks on your to-do list.  Practice relaxation techniques. Try deep breathing, yoga, massage and visualization.  Eat regularly. Eating regularly scheduled meals and maintaining a healthy diet might help prevent headaches. Also, drink plenty of fluids.  Follow a regular sleep schedule. Sleep deprivation might contribute to headaches Consider biofeedback. With this mind-body technique, you learn to control certain bodily functions - such as muscle tension, heart rate and blood pressure - to prevent headaches or reduce headache pain.    Proceed to emergency room if you experience new or worsening symptoms or symptoms do not resolve, if you have new neurologic symptoms or if headache is severe, or for any concerning symptom.    Sarina Ill, MD  [facility] [address] [place], [place] [address]  Phone [phone] Fax [phone]  A total of 30 minutes was spent face-to-face with this patient. Over half  this time was spent on counseling patient on the migraine diagnosis and different diagnostic and therapeutic options available.

## 2016-07-10 DIAGNOSIS — J029 Acute pharyngitis, unspecified: Secondary | ICD-10-CM | POA: Diagnosis not present

## 2016-07-10 NOTE — Telephone Encounter (Signed)
[place] and spoke with Roney Mans. She verified PA they completed was denied. Our office would have to complete an appeal to get medication covered. Since patient has Pharmacist, community they were able to apply discount card so she could receive medication for 6 refills for 20 dollars.  Advised I received a fax from them that they were not able to reach the patient and needed any other contact information if we have it. Katharine Look stated they faxed that to [place] at 1pm and since, the patient has contacted them and agreed to pay the 20 dollars for medication. They informed patient it is up to our office if we complete appeal or not.

## 2016-07-10 NOTE — Telephone Encounter (Signed)
Let's ee if she likes it and postpone until later thanks

## 2016-08-06 ENCOUNTER — Telehealth: Payer: Self-pay | Admitting: *Deleted

## 2016-08-06 NOTE — Telephone Encounter (Signed)
Called and spoke to [place] at [employer]. She stated pt got rx last month for 20 dollars. She verified she can receive 5 more refills for 20 dollars each d/t copay card. She gets a quantity of 9 per month.

## 2016-08-06 NOTE — Telephone Encounter (Signed)
Patient returned phone call. °

## 2016-08-06 NOTE — Telephone Encounter (Signed)
Called and spoke to pt mother (on [place]). She stated pt still at work.  I advised [place] trying to contact her to schedule delivery for next refill. She stated they got notice that insurance declined coverage. She is not sure if pt will continue to get medication because of this.  I advised back on 8/30 I was advised she could receive 6 refills for 20 dollars each d/t copay card. She verbalized understanding. She would like me to call Avella to verify this again.   She is going to give number [phone] to call and verify information.  Pt called back at time I was speaking with mother. Phone staff notified patient and patient advised she will text mother about information. I informed mother of this as well.

## 2016-08-07 ENCOUNTER — Other Ambulatory Visit: Payer: Self-pay | Admitting: *Deleted

## 2016-08-07 MED ORDER — NORTRIPTYLINE HCL 10 MG PO CAPS: 20.0000 mg | ORAL_CAPSULE | Freq: Every day | ORAL | 3 refills | Status: DC

## 2016-08-07 NOTE — Telephone Encounter (Signed)
Called patient back. Relayed information below. Pt verbalized understanding and has no further questions.

## 2016-08-12 ENCOUNTER — Encounter: Payer: Self-pay | Admitting: Neurology

## 2016-09-15 ENCOUNTER — Encounter: Payer: Self-pay | Admitting: Neurology

## 2016-09-19 ENCOUNTER — Encounter: Payer: Self-pay | Admitting: Neurology

## 2016-09-22 DIAGNOSIS — J029 Acute pharyngitis, unspecified: Secondary | ICD-10-CM | POA: Diagnosis not present

## 2016-09-22 DIAGNOSIS — J358 Other chronic diseases of tonsils and adenoids: Secondary | ICD-10-CM | POA: Diagnosis not present

## 2016-09-26 ENCOUNTER — Emergency Department (HOSPITAL_COMMUNITY)
Admission: EM | Admit: 2016-09-26 | Discharge: 2016-09-27 | Disposition: A | Discharge status: Home / Self Care | Payer: BLUE CROSS/BLUE SHIELD | Attending: Emergency Medicine | Admitting: Emergency Medicine

## 2016-09-26 ENCOUNTER — Encounter (HOSPITAL_COMMUNITY): Payer: Self-pay | Admitting: Emergency Medicine

## 2016-09-26 DIAGNOSIS — H538 Other visual disturbances: Secondary | ICD-10-CM | POA: Insufficient documentation

## 2016-09-26 DIAGNOSIS — H539 Unspecified visual disturbance: Secondary | ICD-10-CM

## 2016-09-26 HISTORY — DX: Vitreous degeneration, right eye: H43.811

## 2016-09-26 MED ORDER — FLUORESCEIN SODIUM 1 MG OP STRP
1.0000 | ORAL_STRIP | Freq: Once | OPHTHALMIC | Status: AC
  Administered 2016-09-26: 1 via OPHTHALMIC
  Filled 2016-09-26: qty 1

## 2016-09-26 MED ORDER — TETRACAINE HCL 0.5 % OP SOLN
1.0000 [drp] | Freq: Once | OPHTHALMIC | Status: AC
  Administered 2016-09-26: 1 [drp] via OPHTHALMIC
  Filled 2016-09-26: qty 2

## 2016-09-26 NOTE — ED Triage Notes (Signed)
Patient reports intermittent " Glare and Floaters" at right eye for several days , denies eye injury , no pain or blurred vision .

## 2016-09-26 NOTE — Discharge Instructions (Signed)
Call Dr. Talbert Forest' office on Monday and tell them you were seen in the ED and need a follow up appointment.

## 2016-09-26 NOTE — ED Provider Notes (Signed)
[facility] DEPT Provider Note   CSN: [id number] Arrival date & time: 09/26/16  2027  By signing my name below, I, Reola Mosher, attest that this documentation has been prepared under the direction and in the presence of [facility], [place].  Electronically Signed: Reola Mosher, ED Scribe. 09/26/16. 9:07 PM.  History   Chief Complaint Chief Complaint  Patient presents with  . Eye Problem   The history is provided by the patient. No language interpreter was used.  Eye Problem   This is a new problem. The current episode started more than 1 week ago. The problem occurs constantly. The problem has been gradually worsening. There is a problem in the right eye. There was no injury mechanism. Associated symptoms include photophobia. Pertinent negatives include no nausea and no vomiting.    HPI Comments: [name] is a 30 y.o. female who presents to the Emergency Department complaining of a gradually worsening visual disturbance described as a "glare" to the right eye onset approximately 2 weeks ago. Pt reports associated photophobia secondary to her current visual disturbance. She notes that was recently dx'd with vitreous detachment of the right eye over one month ago, and had f/u one week ago with no reported complications with this issue from her Optometrist. She has not seen an Opthalmologist. No noted treatments for her symptoms were tried prior to coming into the ED. Her visual issues are exacerbated with exposure to light. Pt wears contact lenses at baseline. She has a h/o migraines and is followed by a Neurologist for this issue. She notes that her typically visual symptoms associated with her migraines are unlike her visual symptoms she is experiencing today. Denies eye pain, loss of vision, numbness, weakness, nausea, vomiting, fever, chills, or any other associated symptoms.   Past Medical History:  Diagnosis Date  . GERD (gastroesophageal reflux disease)   . Vitreous  detachment of right eye    Patient Active Problem List   Diagnosis Date Noted  . Migraines 03/26/2016   Past Surgical History:  Procedure Laterality Date  . WISDOM TOOTH EXTRACTION  2004   x4   OB History    No data available     Home Medications    Prior to Admission medications   Medication Sig Start Date End Date Taking? Authorizing Provider  CRYSELLE-28 0.3-30 MG-MCG tablet Take 1 tablet by mouth daily. 02/25/16  Yes Historical Provider, MD  Diclofenac Potassium 50 MG PACK Take 50 mg by mouth once as needed. Take once daily as needed with headache onset. Please take with food 07/09/16  Yes Melvenia Beam, MD  eletriptan (RELPAX) 40 MG tablet Take 1 tablet (40 mg total) by mouth as needed for migraine or headache. May repeat in 2 hours if headache persists or recurs. 07/09/16  Yes Melvenia Beam, MD  omeprazole (PRILOSEC) 20 MG capsule Take 20 mg by mouth 2 (two) times daily. 03/14/16  Yes Historical Provider, MD  PROAIR HFA 108 (90 Base) MCG/ACT inhaler Take 2 puffs by mouth as needed. 06/16/16  Yes Historical Provider, MD  nortriptyline (PAMELOR) 10 MG capsule Take 2 capsules (20 mg total) by mouth at bedtime. Patient not taking: Reported on 09/26/2016 08/07/16   Melvenia Beam, MD  ondansetron (ZOFRAN ODT) 4 MG disintegrating tablet Take 1-2 tablet (4-8 mg total) by mouth every 8 (eight) hours as needed for nausea or vomiting. Patient not taking: Reported on 09/26/2016 07/09/16   Melvenia Beam, MD   Family History Family History  Problem Relation Age of Onset  . High blood pressure Mother   . High blood pressure Father   . Diabetes Mellitus II Maternal Grandmother   . Diabetes Mellitus I Maternal Grandfather   . Lung cancer Maternal Grandfather   . Stroke Paternal Grandfather   . Stroke Paternal Grandmother    Social History Social History  Substance Use Topics  . Smoking status: Never Smoker  . Smokeless tobacco: Never Used  . Alcohol use No   Allergies   Meloxicam  and Sulfa antibiotics  Review of Systems Review of Systems  Constitutional: Negative for chills and fever.  Eyes: Positive for photophobia and visual disturbance. Negative for pain.  Gastrointestinal: Negative for nausea and vomiting.  All other systems reviewed and are negative.  Physical Exam Updated Vital Signs BP 132/74 (BP Location: Right Arm)   Pulse 82   Temp 98.8 F (37.1 C) (Oral)   Resp 20   LMP 09/15/2016 (Approximate)   SpO2 99%   Physical Exam  Constitutional: She appears well-developed and well-nourished. No distress.  HENT:  Head: Normocephalic and atraumatic.  Eyes: Conjunctivae and EOM are normal. Pupils are equal, round, and reactive to light. Right eye exhibits no discharge and no hordeolum. No foreign body present in the right eye.  Fundoscopic exam:      The right eye shows red reflex.  Slit lamp exam:      The right eye shows no corneal abrasion, no corneal ulcer, no foreign body, no hyphema and no fluorescein uptake.  Neck: Normal range of motion.  Cardiovascular: Normal rate.   Pulmonary/Chest: Effort normal.  Abdominal: She exhibits no distension.  Musculoskeletal: Normal range of motion.  Neurological: She is alert.  Skin: No pallor.  Psychiatric: She has a normal mood and affect. Her behavior is normal.  Nursing note and vitals reviewed.  ED Treatments / Results  DIAGNOSTIC STUDIES: Oxygen Saturation is 100% on RA, normal by my interpretation.   COORDINATION OF CARE: 9:07 PM-Discussed next steps with pt. Pt verbalized understanding and is agreeable with the plan.   Labs (all labs ordered are listed, but only abnormal results are displayed) Labs Reviewed - No data to display  Radiology No results found.  Procedures Procedures   Medications Ordered in ED Medications  tetracaine (PONTOCAINE) 0.5 % ophthalmic solution 1 drop (1 drop Right Eye Given 09/26/16 2206)  fluorescein ophthalmic strip 1 strip (1 strip Right Eye Given 09/26/16  2206)    Initial Impression / Assessment and Plan / ED Course  I have reviewed the triage vital signs and the nursing notes.  Pertinent labs & imaging results that were available during my care of the patient were reviewed by me and considered in my medical decision making (see chart for details).  Clinical Course    Pt si a 30yo female who presents to the ED with ongoing visual issues involving the right eye for two weeks. No significant exam findings. No new neurological symptoms. No blurry vision, nausea, vomiting, fevers, or any causes for significant concerns. VSS. Discussed this case w/ my attending, Dr Christy Gentles, who saw this pt. Pt was advised to f/u with an ophthalmologist promptly for this issue which we will refer too. Further, she was advised if her symptoms do not resolve, and all pathologies of the globe of the eye have been r/o, then pt was further instructed to f/u w/ her Neurologist. Pt is comfortable with above plan and is stable for discharge at this time. All questions were  answered prior to disposition. Strict return precautions for return into the ED were discussed such as new neurological symptoms, worsening of vision, further visual changes, loss of vision, or other cause for immediate concern.   Final Clinical Impressions(s) / ED Diagnoses   Final diagnoses:  Visual changes   New Prescriptions Discharge Medication List as of 09/26/2016 11:57 PM     I personally performed the services described in this documentation, which was scribed in my presence. The recorded information has been reviewed and is accurate.     [place], NP 09/27/16 [place], MD 09/27/16 [phone]

## 2016-09-29 DIAGNOSIS — H16141 Punctate keratitis, right eye: Secondary | ICD-10-CM | POA: Diagnosis not present

## 2016-09-29 DIAGNOSIS — G43109 Migraine with aura, not intractable, without status migrainosus: Secondary | ICD-10-CM | POA: Diagnosis not present

## 2016-10-13 DIAGNOSIS — H16141 Punctate keratitis, right eye: Secondary | ICD-10-CM | POA: Diagnosis not present

## 2016-10-13 DIAGNOSIS — G43109 Migraine with aura, not intractable, without status migrainosus: Secondary | ICD-10-CM | POA: Diagnosis not present

## 2016-11-07 ENCOUNTER — Encounter (HOSPITAL_COMMUNITY): Payer: Self-pay | Admitting: *Deleted

## 2016-11-07 ENCOUNTER — Emergency Department (HOSPITAL_COMMUNITY): Payer: BLUE CROSS/BLUE SHIELD

## 2016-11-07 ENCOUNTER — Telehealth: Payer: Self-pay | Admitting: Diagnostic Neuroimaging

## 2016-11-07 ENCOUNTER — Emergency Department (HOSPITAL_COMMUNITY)
Admission: EM | Admit: 2016-11-07 | Discharge: 2016-11-07 | Disposition: A | Discharge status: Home / Self Care | Payer: BLUE CROSS/BLUE SHIELD | Attending: Emergency Medicine | Admitting: Emergency Medicine

## 2016-11-07 DIAGNOSIS — R519 Headache, unspecified: Secondary | ICD-10-CM

## 2016-11-07 DIAGNOSIS — H16141 Punctate keratitis, right eye: Secondary | ICD-10-CM | POA: Diagnosis not present

## 2016-11-07 DIAGNOSIS — G43109 Migraine with aura, not intractable, without status migrainosus: Secondary | ICD-10-CM | POA: Diagnosis not present

## 2016-11-07 DIAGNOSIS — R51 Headache: Secondary | ICD-10-CM | POA: Diagnosis not present

## 2016-11-07 NOTE — ED Provider Notes (Signed)
[facility] DEPT Provider Note   CSN: [id number] Arrival date & time: 11/07/16  1736  By signing my name below, I, [name], attest that this documentation has been prepared under the direction and in the presence of non-physician practitioner, Debroah Baller, NP. Electronically Signed: Jeanell Rosales, Scribe. 11/07/2016. 7:51 PM.  History   Chief Complaint Chief Complaint  Patient presents with  . Headache   The history is provided by the patient. No language interpreter was used.  Headache   This is a recurrent problem. The current episode started 6 to 12 hours ago. The problem occurs constantly. The problem has been resolved. The pain is located in the right unilateral region. The pain is moderate. The pain does not radiate. Pertinent negatives include no fever. Treatments tried: migraine medication. The treatment provided significant relief.   HPI Comments: [name] is a 30 y.o. female with a PMHx of vitreous detachment in right eye who presents to the Emergency Department complaining of constant moderate right-sided headache that started this morning ago. She states she woke up with non-radiating pain and saw a "bright flashing light on the right side". Her neurologist referred her to her ophthalmologist, and they referred her to the ED for a CT scan. They were concerned because of the facial numbness that is not her usual presentation with a migraine. She took migraine medication with relief, and the pain is currently resolved. She reports associated right-sided facial numbness. She denies any weakness or other complaints. Patient reports that she did miss a dose of her medication that she takes to prevent migraines.     PCP: Laura Austria, MD  Past Medical History:  Diagnosis Date  . GERD (gastroesophageal reflux disease)   . Vitreous detachment of right eye     Patient Active Problem List   Diagnosis Date Noted  . Migraines 03/26/2016    Past Surgical  History:  Procedure Laterality Date  . WISDOM TOOTH EXTRACTION  2004   x4    OB History    No data available       Home Medications    Prior to Admission medications   Medication Sig Start Date End Date Taking? Authorizing Provider  CRYSELLE-28 0.3-30 MG-MCG tablet Take 1 tablet by mouth daily. 02/25/16   Historical Provider, MD  Diclofenac Potassium 50 MG PACK Take 50 mg by mouth once as needed. Take once daily as needed with headache onset. Please take with food 07/09/16   Melvenia Beam, MD  eletriptan (RELPAX) 40 MG tablet Take 1 tablet (40 mg total) by mouth as needed for migraine or headache. May repeat in 2 hours if headache persists or recurs. 07/09/16   Melvenia Beam, MD  nortriptyline (PAMELOR) 10 MG capsule Take 2 capsules (20 mg total) by mouth at bedtime. Patient not taking: Reported on 09/26/2016 08/07/16   Melvenia Beam, MD  omeprazole (PRILOSEC) 20 MG capsule Take 20 mg by mouth 2 (two) times daily. 03/14/16   Historical Provider, MD  ondansetron (ZOFRAN ODT) 4 MG disintegrating tablet Take 1-2 tablet (4-8 mg total) by mouth every 8 (eight) hours as needed for nausea or vomiting. Patient not taking: Reported on 09/26/2016 07/09/16   Melvenia Beam, MD  PROAIR HFA [phone] Base) MCG/ACT inhaler Take 2 puffs by mouth as needed. 06/16/16   Historical Provider, MD    Family History Family History  Problem Relation Age of Onset  . High blood pressure Mother   . High blood pressure Father   .  Diabetes Mellitus II Maternal Grandmother   . Diabetes Mellitus I Maternal Grandfather   . Lung cancer Maternal Grandfather   . Stroke Paternal Grandfather   . Stroke Paternal Grandmother     Social History Social History  Substance Use Topics  . Smoking status: Never Smoker  . Smokeless tobacco: Never Used  . Alcohol use No     Allergies   Meloxicam and Sulfa antibiotics   Review of Systems Review of Systems  Constitutional: Negative for fever.  Eyes: Positive for  visual disturbance.  Neurological: Positive for headaches (Right-sided). Negative for weakness.  All other systems reviewed and are negative.    Physical Exam Updated Vital Signs BP 136/73 (BP Location: Right Arm)   Pulse 72   Temp 98.5 F (36.9 C) (Oral)   Resp 16   SpO2 99%   Physical Exam  Constitutional: She is oriented to person, place, and time. She appears well-developed and well-nourished. No distress.  HENT:  Head: Normocephalic and atraumatic.  Right Ear: Tympanic membrane normal.  Left Ear: Tympanic membrane normal.  Nose: Nose normal.  Mouth/Throat: Uvula is midline, oropharynx is clear and moist and mucous membranes are normal. No posterior oropharyngeal edema or posterior oropharyngeal erythema.  No facial weakness.   Eyes: Conjunctivae and EOM are normal. Pupils are equal, round, and reactive to light. No scleral icterus.  Neck: Normal range of motion. Neck supple.  Cardiovascular: Normal rate and regular rhythm.   Pulses:      Radial pulses are 2+ on the right side, and 2+ on the left side.  Adequate circulation.   Pulmonary/Chest: Effort normal. No respiratory distress. She has no wheezes. She has no rales.  Abdominal: Soft. Bowel sounds are normal. There is no tenderness.  Musculoskeletal: Normal range of motion. She exhibits no edema.  Radial and pedal pulses strong, adequate circulation, good touch sensation.  Neurological: She is alert and oriented to person, place, and time. She has normal strength. No cranial nerve deficit or sensory deficit. She displays a negative Romberg sign. Gait normal.  Reflex Scores:      Bicep reflexes are 2+ on the right side and 2+ on the left side.      Brachioradialis reflexes are 2+ on the right side and 2+ on the left side.      Patellar reflexes are 2+ on the right side and 2+ on the left side. Rapid alternating movements without difficulty.Steady gait, no foot drag. Negative Romberg. Stands on one foot without difficulty.   Skin: Skin is warm and dry.  Psychiatric: She has a normal mood and affect. Her behavior is normal.  Nursing note and vitals reviewed.    ED Treatments / Results  DIAGNOSTIC STUDIES: Oxygen Saturation is 99% on RA, normal by my interpretation.    COORDINATION OF CARE: 7:55 PM- Pt advised of plan for treatment and pt agrees.  Labs (all labs ordered are listed, but only abnormal results are displayed) Labs Reviewed - No data to display   Radiology Ct Head Wo Contrast  Result Date: 11/07/2016 CLINICAL DATA:  Right-sided headache and facial numbness over the last day. EXAM: CT HEAD WITHOUT CONTRAST TECHNIQUE: Contiguous axial images were obtained from the base of the skull through the vertex without intravenous contrast. COMPARISON:  None. FINDINGS: Brain: No evidence of malformation, atrophy, old or acute small or large vessel infarction, mass lesion, hemorrhage, hydrocephalus or extra-axial collection. No evidence of pituitary lesion. Vascular: No vascular calcification.  No hyperdense vessels. Skull: Normal.  No fracture or focal bone lesion. Sinuses/Orbits: Visualized sinuses are clear. No fluid in the middle ears or mastoids. Visualized orbits are normal. Other: None significant IMPRESSION: Normal head CT. Electronically Signed   By: Nelson Chimes M.D.   On: 11/07/2016 20:23    Procedures Procedures (including critical care time)  Medications Ordered in ED Medications - No data to display   Initial Impression / Assessment and Plan / ED Course  I have reviewed the triage vital signs and the nursing notes.  Pertinent imaging results that were available during my care of the patient were reviewed by me and considered in my medical decision making (see chart for details).  Clinical Course   30 y.o. female with right side headache and facial numbness stable for d/c with normal neuro exam and normal CT of head. Patient had evaluation for eyes by her pathobiologist prior to coming to  the ED. Patient's headache has resolved. Return precautions given.   Final Clinical Impressions(s) / ED Diagnoses   Final diagnoses:  Right-sided headache    New Prescriptions New Prescriptions   No medications on file   I personally performed the services described in this documentation, which was scribed in my presence. The recorded information has been reviewed and is accurate.     [place], NP 11/07/16 2042    Fatima Blank, MD 11/08/16 [phone]

## 2016-11-07 NOTE — ED Notes (Signed)
Pt stable, ambulatory, states understanding of discharge instructions 

## 2016-11-07 NOTE — Discharge Instructions (Signed)
Your CT of your head today is normal. Follow up with your PCP or your neurologist if the headache returns. Return here as needed. Continue the medications they prescribe for you.

## 2016-11-07 NOTE — Telephone Encounter (Signed)
I received a page from Dr. Talbert Forest office. This morning patient had transient visual disturbance seeing "glitter on the floor" followed by migraine headache. Patient called ophthalmology office for evaluation and she was concerned this could represent retinal detachment. Eye examination was unremarkable and no primary ophthalmology pathology was found. Patient and her family left eye doctor office. After that, eye doctor office called [place] and I received the page. I called back and spoke with Dr. Talbert Forest, who asked on advice on patient management going forward. Based on description via Dr. Talbert Forest, this sounds like typical migraine phenomenon, although it is unusual for patient in that she has never had this symptom in all her years of migraines.  Normally with new neurologic symptoms in a migraine patient, we recommend MRI brain neuroimaging. Due to being the end of the week and and of the day, routine outpatient MRI scan will not be able to be obtained until later next week.   Therefore I recommend patient get evaluated in the emergency room with possible neurology consultation, who may determine need for further evaluation and testing. Dr. Talbert Forest appreciated the advice and will call patient back and relay the information.    Penni Bombard, MD [id number], [id number] PM Certified in Neurology, Neurophysiology and Neuroimaging  [facility] [address] [place], [place] [address] [phone]

## 2016-11-07 NOTE — ED Triage Notes (Addendum)
Sent to ED via pov, by eye specialist and neurologist, for CT Scan. Pt has hx of Migraines. Woke with migraine this am which became worse throughout the day. Pt did see flashes of light in right peripheral vision. Then went to eye specialist due to hx of detatched retina. Examined- negative for detached retina per pt and after conversation between eye doc and neurologist pt was told to come to ED for CT scan. Migraine has subsided.

## 2016-11-07 NOTE — ED Notes (Signed)
Patient transported to CT 

## 2016-11-09 DIAGNOSIS — G43909 Migraine, unspecified, not intractable, without status migrainosus: Secondary | ICD-10-CM | POA: Diagnosis not present

## 2016-11-10 ENCOUNTER — Telehealth: Payer: Self-pay | Admitting: Diagnostic Neuroimaging

## 2016-11-10 NOTE — Telephone Encounter (Signed)
Pt mother called in due to pt having recurrent severe migraine. Triptan helping but wearing off. Advised to use triptan with OTC meds (ibuprofen or tylenol) also, which she was not trying. They are heading to [facility] urgent care now for possible infusion. Agree with plan. Will forward to Dr. Jaynee Eagles for review later this week. -VRP

## 2016-11-11 NOTE — Telephone Encounter (Signed)
[name], can you call patient and see if she would like to come in for follow up since this episode? I can fit her in at noon or 430 one day this month, thanks

## 2016-11-12 ENCOUNTER — Ambulatory Visit: Payer: BLUE CROSS/BLUE SHIELD | Admitting: Neurology

## 2016-11-12 NOTE — Telephone Encounter (Signed)
Dr Ahern- FYI 

## 2016-11-12 NOTE — Telephone Encounter (Signed)
Called pt. Scheduled f/u for 11/20/15 at 4pm, check in 345pm. Pt verbalized understanding.

## 2016-11-19 ENCOUNTER — Ambulatory Visit: Payer: BLUE CROSS/BLUE SHIELD | Admitting: Neurology

## 2016-11-19 ENCOUNTER — Ambulatory Visit (INDEPENDENT_AMBULATORY_CARE_PROVIDER_SITE_OTHER): Payer: BLUE CROSS/BLUE SHIELD | Admitting: Neurology

## 2016-11-19 ENCOUNTER — Encounter: Payer: Self-pay | Admitting: Neurology

## 2016-11-19 VITALS — BP 126/77 | HR 84 | Ht 62.0 in | Wt 133.6 lb

## 2016-11-19 DIAGNOSIS — G43109 Migraine with aura, not intractable, without status migrainosus: Secondary | ICD-10-CM

## 2016-11-19 DIAGNOSIS — G43909 Migraine, unspecified, not intractable, without status migrainosus: Secondary | ICD-10-CM | POA: Diagnosis not present

## 2016-11-19 MED ORDER — PROPRANOLOL HCL 10 MG PO TABS: 20.0000 mg | ORAL_TABLET | Freq: Two times a day (BID) | ORAL | 6 refills | Status: DC

## 2016-11-19 NOTE — Progress Notes (Signed)
[id number]    Provider:  Dr Jaynee Eagles Referring Provider: Maury Dus, MD Primary Care Physician:  Vena Austria, MD  CC: Headaches  Interval history 11/19/2016:  She has had 2 headaches with vision changes in the right eye and was seen at the ED for severe headache. Relpax helps along with cambia. She has 10 headache days a month and the following number of migraines(below). Mother is here who also has similar headaches. We discussed medications, she is toelrating propranolol well withh slowly increase. Discussed triptan's, Relpax appears to work but may wear off advised her to try Relpax with Cambia and zofran and also discussed Zomig (provided samples of nasal spray) and other triptan's and different ways to administer including nose sprays and powders and injectables. Also discussed Cephaly device and other devices available on the market. Migraines: September 2  October 4 Nov 3 Dec 4    Tried: Topiramate and nortriptyline, now on propranolol  Interval history 07/09/2016: Imitrex not entirely working and  Makes neck stiff, will try Relpax next. Weekends are worse. In June had 4 headaches days ranging from 4-6/10 in pain. In July she had 4 mild headaches 2-4/10, moderate 2 6-8/10 and had one very severe. They slowly progress but can get bad. She has had 4 in August one 7/10. She feels dehydration makes them worse. She has difficulty sleeping throughout the night. She has tried imitrex and ibuprofen and tylenol. She has a lot nausea and pills make her sick and difficulty swallowing. Discussed starting a daily preventative and also options for acute management. We'll start nortriptyline at night for prevention. We'll provide Relpax, Zofran and can be for acute management. Patient is to see me back in 4 months.   [id number] [name] a 31 y.o.femalehere as a referral from Dr. Rhona Leavens headaches. Past medical history of asthma, Headaches started for most of  her life since high school. Worsening over the last 5-6 years. They are more intense, has to lay down. Usually around the eyes, mostly around the temples. Usually more unilateral then switches to the other side. Throbbing behind the eyes. Light sensitivity, shuts the blinds, lays down with an ice pack. +nausea but no vomiting. She has them on the weekends. 10 headache days a month. 2-3 are migraines. Migraines last the whole day. Gradually gets stronger over a few hours, can get to a 7/10 in pain at its worst on average 3-5/10. Last all day, up to 24 hours. No aura. No dizziness or vertigo or weakness or sensory changes. Mother, aunt, grandmother with migraines. No plans to get pregnant. No medication overuse. Takes Excedrin occ. Or Tylenol but no more than a few times a week. The non-migrainous headaches last just a few hours and they are dull and not too painful. No blurry vision. No aura. No family history of headaches.  Reviewed notes, labs and imaging from outside physicians, which showed: Reviewed notes from OB/GYN. She was started on birth control pills for management of abnormal bleeding, her regulars were regular but she was having midcycle spotting. No significant dysmenorrhea. Reported she had problems on and up with headaches for some time that it never been evaluated. Not sexually active. No urinary or bowel complaints. Exam was negative, thyroid nonpalpable, breasts normal, lungs clear, regular rate and rhythm without murmurs or gallop, no carotid or abdominal bruits, abdominal exam benign, no masses or organomegaly or tenderness, normal external genitalia, cervix unremarkable, uterus and adnexa normal.  Review of Systems: Patient complains of symptoms per HPI  as well as the following symptoms: No reported cardiac problems, no chest pain or shortness of breath, no fevers or systemic signs. Pertinent negatives per HPI. All others negative.   Social History   Social History  . Marital  status: Single    Spouse name: N/A  . Number of children: 0  . Years of education: 39   Occupational History  . [employer]    Social History Main Topics  . Smoking status: Never Smoker  . Smokeless tobacco: Never Used  . Alcohol use No  . Drug use: No  . Sexual activity: Not on file   Other Topics Concern  . Not on file   Social History Narrative   Lives with mother and stepfather   Caffeine use: 1 coffee/day   1 soda per day    Family History  Problem Relation Age of Onset  . High blood pressure Mother   . High blood pressure Father   . Diabetes Mellitus II Maternal Grandmother   . Diabetes Mellitus I Maternal Grandfather   . Lung cancer Maternal Grandfather   . Stroke Paternal Grandfather   . Stroke Paternal Grandmother     Past Medical History:  Diagnosis Date  . GERD (gastroesophageal reflux disease)   . Vitreous detachment of right eye     Past Surgical History:  Procedure Laterality Date  . WISDOM TOOTH EXTRACTION  2004   x4    Current Outpatient Prescriptions  Medication Sig Dispense Refill  . CRYSELLE-28 0.3-30 MG-MCG tablet Take 1 tablet by mouth daily.  0  . Diclofenac Potassium 50 MG PACK Take 50 mg by mouth once as needed. Take once daily as needed with headache onset. Please take with food 30 each 30  . eletriptan (RELPAX) 40 MG tablet Take 1 tablet (40 mg total) by mouth as needed for migraine or headache. May repeat in 2 hours if headache persists or recurs. 9 tablet 12  . omeprazole (PRILOSEC) 20 MG capsule Take 20 mg by mouth 2 (two) times daily.  12  . ondansetron (ZOFRAN ODT) 4 MG disintegrating tablet Take 1-2 tablet (4-8 mg total) by mouth every 8 (eight) hours as needed for nausea or vomiting. 20 tablet 12  . PROAIR HFA 108 (90 Base) MCG/ACT inhaler Take 2 puffs by mouth as needed.  2  . propranolol (INDERAL) 10 MG tablet Take 10 mg by mouth daily.  0   No current facility-administered medications for this visit.      Allergies as of 11/19/2016 - Review Complete 11/19/2016  Allergen Reaction Noted  . Meloxicam  03/25/2016  . Sulfa antibiotics Hives 03/25/2016    Vitals: BP 126/77 (BP Location: Right Arm, Patient Position: Sitting, Cuff Size: Normal)   Pulse 84   Ht 5\' 2"  (1.575 m)   Wt 133 lb 9.6 oz (60.6 kg)   BMI 24.44 kg/m  Last Weight:  Wt Readings from Last 1 Encounters:  11/19/16 133 lb 9.6 oz (60.6 kg)   Last Height:   Ht Readings from Last 1 Encounters:  11/19/16 5\' 2"  (1.575 m)   Physical exam: Exam: Gen: NAD, conversant, well nourised, well groomed  CV: RRR, no MRG. No Carotid Bruits. No peripheral edema, warm, nontender Eyes: Conjunctivae clear without exudates or hemorrhage  Neuro: Detailed Neurologic Exam  Speech: Speech is normal; fluent and spontaneous with normal comprehension.  Cognition: The patient is oriented to person, place, and time;  recent and remote memory intact;  language fluent;  normal attention, concentration,  fund of knowledge Cranial Nerves: The pupils are equal, round, and reactive to light. The fundi are normal and spontaneous venous pulsations are present. Visual fields are full to finger confrontation. Extraocular movements are intact. Trigeminal sensation is intact and the muscles of mastication are normal. The face is symmetric. The palate elevates in the midline. Hearing intact. Voice is normal. Shoulder shrug is normal. The tongue has normal motion without fasciculations.   Coordination: Normal finger to nose and heel to shin. Normal rapid alternating movements.   Gait: Heel-toe and tandem gait are normal.   Motor Observation: No asymmetry, no atrophy, and no involuntary movements noted. Tone: Normal muscle tone.   Posture: Posture is normal. normal erect  Strength: Strength is V/V in the upper and lower limbs.   Sensation: intact to LT  Reflex  Exam:  DTR's: Deep tendon reflexes in the upper and lower extremities are normal bilaterally.  Toes: The toes are downgoing bilaterally.  Clonus: Clonus is absent.      Assessment/Plan:31 year old female with migraines without aura without status migrainosus not intractable. Discussed preventative and acute management of migraines. Discussed lifestyle adjustments and watching for triggers, keeping a headache diary. Patient does not want to go on preventative medication at this time, can continue with just acute management.  As far as your medications are concerned, I would like to suggest:  Relpax: Please take one tablet at the onset of your headache. If it does not improve the symptoms please take one additional tablet in 2 hours. Do not take more then 2 tablets in 24hrs. Do not take use more then 2 to 3 days in a week.The most common side-effects are feeling sick (nausea), dizzinessand dry mouth. In addition, triptans can also cause some people to experience strange sensations. These may be a tightness, tingling, flushing, and feelings of heaviness or pressure in areas such as the face and limbs, and occasionally the chest. Serious side effects can include stroke, cardiac side effects such as chest tightness, shortness of breath and possible cardiovascular adverse effects. Do not take his medication if you are pregnant.  May take with zofran and cambia, do not take with Zomig Can try Frova which is a longer acting triptan next Can slowly increase propranolol, discussed side effects especially arrhythmias, chest pain, shortness of breath, bradycardia and hypotension, stop for anything significant including worsening mood. Discussed Botox for migraine. Let her borrow a Cephaly device for a few weeks, she is to bring it back to the office Patient has been seen at the emergency room in urgent care. Migraines are terminated by Toradol injections. Discussed Toradol, side  effects, can see if we can get her some toradol injectables.   Discussed: There is increased risk for stroke in women with migraine with aura and a  Contraindication for the combined contraceptive pill for use by women who have migraine with aura, which is in line with World Health Organisation recommendations. The risk for women with migraine without aura is lower and other risk factors like smoking are far more likely to increase stroke risk than migraine. There is a recommendation for no smoking and for the use of low estrogen or progestogen only pills particularly for women with migraine with aura. It is important however that women with migraine who are taking the pill do not decide to suddenly stop taking it without discussing this with their doctor. Please discuss with her OB/GYN.  Discussed side effects as per patient instructions. Do not Pregnant his medications as they  can cause birth defects.  To prevent or relieve headaches, try the following:  Cool Compress. Lie down and place a cool compress on your head.   Avoid headache triggers. If certain foods or odors seem to have triggered your migraines in the past, avoid them. A headache diary might help you identify triggers.   Include physical activity in your daily routine. Try a daily walk or other moderate aerobic exercise.   Manage stress. Find healthy ways to cope with the stressors, such as delegating tasks on your to-do list.   Practice relaxation techniques. Try deep breathing, yoga, massage and visualization.   Eat regularly. Eating regularly scheduled meals and maintaining a healthy diet might help prevent headaches. Also, drink plenty of fluids.   Follow a regular sleep schedule. Sleep deprivation might contribute to headaches  Consider biofeedback. With this mind-body technique, you learn to control certain bodily functions - such as muscle tension, heart rate and blood pressure - to prevent headaches or reduce headache  pain.    Proceed to emergency room if you experience new or worsening symptoms or symptoms do not resolve, if you have new neurologic symptoms or if headache is severe, or for any concerning symptom.   A total of 60 minutes was spent in with this patient. Over half this time was spent on counseling patient on the migraine diagnosis and different therapeutic options available.

## 2016-11-19 NOTE — Patient Instructions (Addendum)
Remember to drink plenty of fluid, eat healthy meals and do not skip any meals. Try to eat protein with a every meal and eat a healthy snack such as fruit or nuts in between meals. Try to keep a regular sleep-wake schedule and try to exercise daily, particularly in the form of walking, 20-30 minutes a day, if you can.   Our phone number is [phone]. We also have an after hours call service for urgent matters and there is a physician on-call for urgent questions. For any emergencies you know to call 911 or go to the nearest emergency room  Ketorolac injection What is this medicine? KETOROLAC (kee toe ROLE ak) is a non-steroidal anti-inflammatory drug (NSAID). It is used to treat moderate to severe pain for up to 5 days. It is commonly used after surgery. This medicine should not be used for more than 5 days. This medicine may be used for other purposes; ask your health care provider or pharmacist if you have questions. COMMON BRAND NAME(S): Toradol What should I tell my health care provider before I take this medicine? They need to know if you have any of these conditions: -asthma, especially aspirin-sensitive asthma -bleeding problems -kidney disease -stomach bleed, ulcer, or other problem -taking aspirin, other NSAID, or probenecid -an unusual or allergic reaction to ketorolac, tromethamine, aspirin, other NSAIDs, other medicines, foods, dyes or preservatives -pregnant or trying to get pregnant -breast-feeding How should I use this medicine? This medicine is for injection into a muscle or into a vein. It is given by a health care professional in a hospital or clinic setting. Talk to your pediatrician regarding the use of this medicine in children. While this drug may be prescribed for children as young as 46 years old for selected conditions, precautions do apply. Patients over 30 years old may have a stronger reaction and need a smaller dose. Overdosage: If you think you have taken too  much of this medicine contact a poison control center or emergency room at once. NOTE: This medicine is only for you. Do not share this medicine with others. What if I miss a dose? This does not apply. What may interact with this medicine? Do not take this medicine with any of the following medications: -aspirin and aspirin-like medicines -cidofovir -methotrexate -NSAIDs, medicines for pain and inflammation, like ibuprofen or naproxen -pentoxifylline -probenecid This medicine may also interact with the following medications: -alcohol -alendronate -alprazolam -carbamazepine -diuretics -flavocoxid -fluoxetine -ginkgo -lithium -medicines for blood pressure like enalapril -medicines that affect platelets like pentoxifylline -medicines that treat or prevent blood clots like heparin, warfarin -muscle relaxants -pemetrexed -phenytoin -thiothixene This list may not describe all possible interactions. Give your health care provider a list of all the medicines, herbs, non-prescription drugs, or dietary supplements you use. Also tell them if you smoke, drink alcohol, or use illegal drugs. Some items may interact with your medicine. What should I watch for while using this medicine? Tell your doctor or healthcare professional if your symptoms do not start to get better or if they get worse. This medicine does not prevent heart attack or stroke. In fact, this medicine may increase the chance of a heart attack or stroke. The chance may increase with longer use of this medicine and in people who have heart disease. If you take aspirin to prevent heart attack or stroke, talk with your doctor or health care professional. Do not take medicines such as ibuprofen and naproxen with this medicine. Side effects such as stomach upset,  nausea, or ulcers may be more likely to occur. Many medicines available without a prescription should not be taken with this medicine. This medicine can cause ulcers and  bleeding in the stomach and intestines at any time during treatment. Do not smoke cigarettes or drink alcohol. These increase irritation to your stomach and can make it more susceptible to damage from this medicine. Ulcers and bleeding can happen without warning symptoms and can cause death. This medicine can cause you to bleed more easily. Try to avoid damage to your teeth and gums when you brush or floss your teeth. What side effects may I notice from receiving this medicine? Side effects that you should report to your doctor or health care professional as soon as possible: -allergic reactions like skin rash, itching or hives, swelling of the face, lips, or tongue -breathing problems -high blood pressure -nausea, vomiting -redness, blistering, peeling or loosening of the skin, including inside the mouth -severe stomach pain -signs and symptoms of bleeding such as bloody or black, tarry stools; red or dark-brown urine; spitting up blood or brown material that looks like coffee grounds; red spots on the skin; unusual bruising or bleeding from the eye, gums, or nose -signs and symptoms of a blood clot changes in vision; chest pain; severe, sudden headache; trouble speaking; sudden numbness or weakness of the face, arm, or leg -trouble passing urine or change in the amount of urine -unexplained weight gain or swelling -unusually weak or tired -yellowing of eyes or skin Side effects that usually do not require medical attention (report to your doctor or health care professional if they continue or are bothersome): -diarrhea -dizziness -headache -heartburn This list may not describe all possible side effects. Call your doctor for medical advice about side effects. You may report side effects to FDA at 1-800-FDA-1088. Where should I keep my medicine? This drug is given in a hospital or clinic and will not be stored at home. NOTE: This sheet is a summary. It may not cover all possible information.  If you have questions about this medicine, talk to your doctor, pharmacist, or health care provider.  2017 [employer]vier/Gold Standard (2013-03-15 16:34:56)

## 2016-11-20 ENCOUNTER — Telehealth: Payer: Self-pay | Admitting: Neurology

## 2016-11-20 NOTE — Telephone Encounter (Signed)
Called patient pharmacy and gave VO for toradol injections for at home. Called in rx toradol, 21mL syringe with 23G needle and 18G 1 inch needle. Draw up toradol with 18G needle into 77mL syringe. Discard 18G needle and attach 23G to do IM injection.  Placed medication on pt med list.

## 2016-11-20 NOTE — Telephone Encounter (Signed)
Emma, I'd like to send patient toradol injections for acute management of severe headaches. 60mg  IM injections up to 5x a month as needed. Would you call her pharmacy and see how we order that and what needles/how we order the materials for her to self inject and draw it up? Thanks!

## 2016-11-21 DIAGNOSIS — K210 Gastro-esophageal reflux disease with esophagitis: Secondary | ICD-10-CM | POA: Diagnosis not present

## 2016-11-21 DIAGNOSIS — R1013 Epigastric pain: Secondary | ICD-10-CM | POA: Diagnosis not present

## 2016-12-01 DIAGNOSIS — M79641 Pain in right hand: Secondary | ICD-10-CM | POA: Diagnosis not present

## 2016-12-01 DIAGNOSIS — M545 Low back pain: Secondary | ICD-10-CM | POA: Diagnosis not present

## 2016-12-09 ENCOUNTER — Encounter (HOSPITAL_COMMUNITY): Payer: Self-pay | Admitting: Emergency Medicine

## 2016-12-09 ENCOUNTER — Emergency Department (HOSPITAL_COMMUNITY)
Admission: EM | Admit: 2016-12-09 | Discharge: 2016-12-09 | Disposition: A | Discharge status: Home / Self Care | Payer: BLUE CROSS/BLUE SHIELD | Attending: Emergency Medicine | Admitting: Emergency Medicine

## 2016-12-09 ENCOUNTER — Emergency Department (HOSPITAL_COMMUNITY): Payer: BLUE CROSS/BLUE SHIELD

## 2016-12-09 DIAGNOSIS — Z79899 Other long term (current) drug therapy: Secondary | ICD-10-CM | POA: Insufficient documentation

## 2016-12-09 DIAGNOSIS — R079 Chest pain, unspecified: Secondary | ICD-10-CM | POA: Diagnosis not present

## 2016-12-09 LAB — CBC
HCT: 39.9 % (ref 36.0–46.0)
Hemoglobin: 13.1 g/dL (ref 12.0–15.0)
MCH: 27.6 pg (ref 26.0–34.0)
MCHC: 32.8 g/dL (ref 30.0–36.0)
MCV: 84.2 fL (ref 78.0–100.0)
Platelets: 225 10*3/uL (ref 150–400)
RBC: 4.74 MIL/uL (ref 3.87–5.11)
RDW: 13.6 % (ref 11.5–15.5)
WBC: 4.6 10*3/uL (ref 4.0–10.5)

## 2016-12-09 LAB — BASIC METABOLIC PANEL
Anion gap: 7 (ref 5–15)
BUN: 8 mg/dL (ref 6–20)
CALCIUM: 9.7 mg/dL (ref 8.9–10.3)
CHLORIDE: 106 mmol/L (ref 101–111)
CO2: 28 mmol/L (ref 22–32)
CREATININE: 0.73 mg/dL (ref 0.44–1.00)
Glucose, Bld: 96 mg/dL (ref 65–99)
Potassium: 4.1 mmol/L (ref 3.5–5.1)
SODIUM: 141 mmol/L (ref 135–145)

## 2016-12-09 LAB — I-STAT TROPONIN, ED: Troponin i, poc: 0 ng/mL (ref 0.00–0.08)

## 2016-12-09 NOTE — ED Triage Notes (Signed)
Pt c/o muscle tightness in L side of chest going down left arm. Pt denies being sick recently.

## 2016-12-09 NOTE — ED Provider Notes (Signed)
[facility] DEPT Provider Note   CSN: [id number] Arrival date & time: 12/09/16  1542     History   Chief Complaint Chief Complaint  Patient presents with  . Chest Pain    HPI [name] is a 31 y.o. female.  Patient with concern for left-sided chest pain. Started yesterday at 4 in the afternoon periods been constant and does wax and wane. Does sleep radiate to the left arm. Associated with some numbness in the left little finger. Patient without any other symptoms no nausea no vomiting no shortness of breath no leg swelling. No history of similar problem. Patient then goes on to relate that has had some unusual findings in the right arm and right leg in the past that were essentially resolved. So occasionally does get some funny feelings in her extremities. Patient does have a primary care doctor.      Past Medical History:  Diagnosis Date  . GERD (gastroesophageal reflux disease)   . Vitreous detachment of right eye     Patient Active Problem List   Diagnosis Date Noted  . Migraines 03/26/2016    Past Surgical History:  Procedure Laterality Date  . WISDOM TOOTH EXTRACTION  2004   x4    OB History    No data available       Home Medications    Prior to Admission medications   Medication Sig Start Date End Date Taking? Authorizing Provider  acetaminophen (TYLENOL) 500 MG tablet Take 500 mg by mouth every 6 (six) hours as needed for headache.   Yes Historical Provider, MD  Diclofenac Potassium 50 MG PACK Take 50 mg by mouth once as needed. Take once daily as needed with headache onset. Please take with food 07/09/16  Yes Melvenia Beam, MD  eletriptan (RELPAX) 40 MG tablet Take 1 tablet (40 mg total) by mouth as needed for migraine or headache. May repeat in 2 hours if headache persists or recurs. 07/09/16  Yes Melvenia Beam, MD  ketorolac (TORADOL) 30 MG/ML injection Inject 30 mg into the vein once.   Yes Historical Provider, MD  ondansetron (ZOFRAN ODT) 4  MG disintegrating tablet Take 1-2 tablet (4-8 mg total) by mouth every 8 (eight) hours as needed for nausea or vomiting. 07/09/16  Yes Melvenia Beam, MD  PROAIR HFA 108 [phone] Base) MCG/ACT inhaler Take 2 puffs by mouth as needed for wheezing or shortness of breath.  06/16/16  Yes Historical Provider, MD  propranolol (INDERAL) 10 MG tablet Take 2 tablets (20 mg total) by mouth 2 (two) times daily. Patient taking differently: Take 10 mg by mouth daily.  11/19/16  Yes Melvenia Beam, MD    Family History Family History  Problem Relation Age of Onset  . High blood pressure Mother   . High blood pressure Father   . Diabetes Mellitus II Maternal Grandmother   . Diabetes Mellitus I Maternal Grandfather   . Lung cancer Maternal Grandfather   . Stroke Paternal Grandfather   . Stroke Paternal Grandmother     Social History Social History  Substance Use Topics  . Smoking status: Never Smoker  . Smokeless tobacco: Never Used  . Alcohol use No     Allergies   Meloxicam and Sulfa antibiotics   Review of Systems Review of Systems  Constitutional: Negative for diaphoresis and fever.  HENT: Negative for congestion.   Eyes: Negative for visual disturbance.  Respiratory: Negative for shortness of breath.   Cardiovascular: Positive for chest pain.  Negative for palpitations and leg swelling.  Gastrointestinal: Negative for abdominal pain, nausea and vomiting.  Genitourinary: Negative for dysuria.  Musculoskeletal: Positive for back pain.  Neurological: Positive for numbness. Negative for syncope, speech difficulty, weakness and headaches.  Hematological: Does not bruise/bleed easily.  Psychiatric/Behavioral: Negative for confusion.     Physical Exam Updated Vital Signs BP 129/82   Pulse 69   Temp 98.8 F (37.1 C) (Oral)   Resp 16   Ht 5\' 2"  (1.575 m)   Wt 59 kg   LMP 12/05/2016   SpO2 100%   BMI 23.78 kg/m   Physical Exam  Constitutional: She is oriented to person, place, and  time. She appears well-developed and well-nourished.  HENT:  Head: Normocephalic and atraumatic.  Mouth/Throat: Oropharynx is clear and moist.  Eyes: Conjunctivae and EOM are normal. Pupils are equal, round, and reactive to light.  Neck: Normal range of motion. Neck supple.  Cardiovascular: Normal rate and regular rhythm.   Pulmonary/Chest: Effort normal and breath sounds normal. No respiratory distress.  Abdominal: Soft. Bowel sounds are normal. There is no tenderness.  Musculoskeletal: Normal range of motion. She exhibits no edema.  Left radial pulses 2+. Refill less than 2 seconds.  Neurological: She is alert and oriented to person, place, and time. No cranial nerve deficit or sensory deficit. She exhibits normal muscle tone. Coordination normal.  Skin: Skin is warm. Capillary refill takes less than 2 seconds.  Nursing note and vitals reviewed.    ED Treatments / Results  Labs (all labs ordered are listed, but only abnormal results are displayed) Labs Reviewed  BASIC METABOLIC PANEL  CBC  I-STAT [place], ED    EKG  EKG Interpretation  Date/Time:  Tuesday December 09 2016 15:50:54 EST Ventricular Rate:  81 PR Interval:  100 QRS Duration: 82 QT Interval:  370 QTC Calculation: 429 R Axis:   81 Text Interpretation:  Sinus rhythm with short PR Otherwise normal ECG No previous ECGs available Confirmed by Makynlie Rossini  MD, Shanice Poznanski [phone]) on 12/09/2016 7:20:49 PM       Radiology Dg Chest 2 View  Result Date: 12/09/2016 CLINICAL DATA:  Left-sided chest pain radiating to the left arm EXAM: CHEST  2 VIEW COMPARISON:  Chest x-ray of 05/26/2014 FINDINGS: No active infiltrate or effusion is seen. Mediastinal and hilar contours are unremarkable. The heart is within normal limits in size. No bony abnormality is seen. IMPRESSION: No active cardiopulmonary disease. Electronically Signed   By: Ivar Drape M.D.   On: 12/09/2016 16:40    Procedures Procedures (including critical care  time)  Medications Ordered in ED Medications - No data to display   Initial Impression / Assessment and Plan / ED Course  I have reviewed the triage vital signs and the nursing notes.  Pertinent labs & imaging results that were available during my care of the patient were reviewed by me and considered in my medical decision making (see chart for details).     Patient with constant left-sided chest pain radiating to the left arm since yesterday afternoon. Waxes and wanes. Not associated with shortness of breath. Not associated with any other symptoms. No nausea no vomiting no feeling like she's got pass out. No upper back pain. No leg swelling. Is associated with some numbness in the left little finger.  Chest x-ray negative troponin negative labs normal EKG without acute changes. Symptoms also do not seem to be consistent with a pulmonary embolus. There is no tachycardia oxygen saturation is upper  90s.    Final Clinical Impressions(s) / ED Diagnoses   Final diagnoses:  Chest pain, unspecified type    New Prescriptions New Prescriptions   No medications on file     Fredia Sorrow, MD 12/09/16 2009

## 2016-12-09 NOTE — Discharge Instructions (Signed)
Return for any new or worse symptoms.  If symptoms persist or other symptoms develop recommend close follow-up with your primary care doctor. Additional workup as we discussed.   Today's workup without any acute findings.

## 2016-12-17 ENCOUNTER — Telehealth: Payer: Self-pay | Admitting: Neurology

## 2016-12-17 NOTE — Telephone Encounter (Signed)
Patient is calling in reference to propranolol (INDERAL) 10 MG tablet.  Patient started taking medication end of December for the past 2-3 weeks she has been having dizziness, muscle twitching, eye twitching.   Patient also seen in ED last week not sure if this is all related, but wants to know if this can possibly be side effects from medication.  Please call

## 2016-12-18 NOTE — Telephone Encounter (Addendum)
Dizziness, lightheadedness, or tiredness can occur as your body adjusts to the medication. Nausea/vomiting, stomach pain, vision changes, trouble sleeping, and unusual dreams may also occur. This drug may reduce blood flow to your hands and feet, causing them to feel cold. More serious side effects can include irregular or slow heart rate, chest pain, shortness of breath or low blood pressure.  Returned call and spoke to pt. She said that the first couple of weeks she was on propanolol, she did fine on 10 mg but had severe dizziness when increasing to 20 mg BID. Over the past few weeks, she's gone back down to taking the 10 mg dose. Reports that she's had ongoing dizziness particularly when when moving/turning her head. Also c/o pressure in the back of her head that is different than her usual migraines. Recently saw PCP for muscle (esp eye) twitching and back pain "all of the way down into her tailbone" w/ leg numbness and was told that she probably had something compressed. Then last week, she went to ER w/ c/o chest pain radiating to left arm and numbness in pinky finger. No SOB. CXR neg. VS, EKG and labs were all normal. Today, pt has decreased propanolol dose even further to taking only 10 mg daily and feels some better on this.

## 2016-12-19 NOTE — Telephone Encounter (Signed)
Stop it and see what happens if she feels better we will change medications to something different

## 2016-12-22 NOTE — Telephone Encounter (Signed)
Called pt and left mssg asking her to return call and report how she's doing on lower dose of propanolol.

## 2016-12-30 ENCOUNTER — Ambulatory Visit: Payer: BLUE CROSS/BLUE SHIELD | Admitting: Neurology

## 2017-01-02 DIAGNOSIS — H5213 Myopia, bilateral: Secondary | ICD-10-CM | POA: Diagnosis not present

## 2017-01-06 NOTE — Telephone Encounter (Signed)
Patient returning your call. She states she is still feeling dizzy from propranolol (INDERAL) 10 MG tablet. Please call and discuss.

## 2017-01-06 NOTE — Telephone Encounter (Signed)
Returned pt TC and instructed her to stop propanolol per Dr. Jaynee Eagles. She may call back when feeling better and we can start a new medication. Should also call if any additional questions/concerns.

## 2017-01-06 NOTE — Addendum Note (Signed)
Addended by: Monte Fantasia on: 01/06/2017 04:47 PM   Modules accepted: Orders

## 2017-03-09 DIAGNOSIS — Z6824 Body mass index (BMI) 24.0-24.9, adult: Secondary | ICD-10-CM | POA: Diagnosis not present

## 2017-03-09 DIAGNOSIS — Z01419 Encounter for gynecological examination (general) (routine) without abnormal findings: Secondary | ICD-10-CM | POA: Diagnosis not present

## 2017-03-10 ENCOUNTER — Other Ambulatory Visit: Payer: Self-pay | Admitting: Obstetrics and Gynecology

## 2017-03-10 DIAGNOSIS — R221 Localized swelling, mass and lump, neck: Secondary | ICD-10-CM

## 2017-03-10 DIAGNOSIS — R200 Anesthesia of skin: Secondary | ICD-10-CM

## 2017-03-13 ENCOUNTER — Ambulatory Visit
Admission: RE | Admit: 2017-03-13 | Discharge: 2017-03-13 | Disposition: A | Discharge status: Home / Self Care | Payer: BLUE CROSS/BLUE SHIELD | Source: Ambulatory Visit | Attending: Obstetrics and Gynecology | Admitting: Obstetrics and Gynecology

## 2017-03-13 DIAGNOSIS — R200 Anesthesia of skin: Secondary | ICD-10-CM

## 2017-03-13 DIAGNOSIS — R221 Localized swelling, mass and lump, neck: Secondary | ICD-10-CM

## 2017-03-13 MED ORDER — IOPAMIDOL (ISOVUE-300) INJECTION 61%
75.0000 mL | Freq: Once | INTRAVENOUS | Status: AC | PRN
  Administered 2017-03-13: 75 mL via INTRAVENOUS

## 2017-04-09 DIAGNOSIS — Z1231 Encounter for screening mammogram for malignant neoplasm of breast: Secondary | ICD-10-CM | POA: Diagnosis not present

## 2017-04-10 DIAGNOSIS — D249 Benign neoplasm of unspecified breast: Secondary | ICD-10-CM

## 2017-04-10 HISTORY — PX: BREAST BIOPSY: SHX20

## 2017-04-10 HISTORY — DX: Benign neoplasm of unspecified breast: D24.9

## 2017-04-13 ENCOUNTER — Telehealth: Payer: Self-pay

## 2017-04-13 ENCOUNTER — Encounter: Payer: Self-pay | Admitting: Neurology

## 2017-04-13 ENCOUNTER — Other Ambulatory Visit: Payer: Self-pay | Admitting: Obstetrics and Gynecology

## 2017-04-13 ENCOUNTER — Ambulatory Visit (INDEPENDENT_AMBULATORY_CARE_PROVIDER_SITE_OTHER): Payer: BLUE CROSS/BLUE SHIELD | Admitting: Neurology

## 2017-04-13 VITALS — BP 124/85 | HR 84 | Ht 62.0 in | Wt 136.6 lb

## 2017-04-13 DIAGNOSIS — G43009 Migraine without aura, not intractable, without status migrainosus: Secondary | ICD-10-CM

## 2017-04-13 DIAGNOSIS — R928 Other abnormal and inconclusive findings on diagnostic imaging of breast: Secondary | ICD-10-CM

## 2017-04-13 MED ORDER — TOPIRAMATE ER 50 MG PO SPRINKLE CAP24: 50.0000 mg | EXTENDED_RELEASE_CAPSULE | Freq: Every day | ORAL | 12 refills | Status: DC

## 2017-04-13 MED ORDER — ZOLMITRIPTAN 5 MG NA SOLN: 1.0000 | NASAL | 11 refills | Status: DC | PRN

## 2017-04-13 NOTE — Telephone Encounter (Signed)
Erenumab service request form and rx completed, signed and faxed to [employer].

## 2017-04-13 NOTE — Progress Notes (Signed)
[facility]    Provider:  Dr Jaynee Eagles Referring Provider: Maury Dus, MD Primary Care Physician:  Maury Dus, MD  CC: Headaches  Interval history 04/13/2017:  Patient is here for follow-up of migraines. She has failed multiple medications as below. She is using relpax and zofran for acute management and may also use a cambia if needed. She did not tolerate propranolol. She has side effects to medications. With Nortriptyline she did not sleep well and was anxious. The Toradol shots work well. But no preventative has worked.  Will try Qudexy and also Erenumab. She has 15 headache days a month and superimposed migraines as below. Discussed teratogenicity of topiramate do not get pregnant. We'll see her back in 4 months. She is to email me so we can slowly increase the topiramate extended release. Stop for any side effects or email me.  January: 5 migraines necessitating Relpax. February 2 migraines, March had 5 migraines and 6 headaches needing treatment of other kind, May 9 migraines necessitating relpax and a few others where she took tylenol or excedin. No medication overuse.  Tried: Topiramate IR and nortriptyline(do not tolerate), propranolol(did not tolerate), relpax(works), zofran, cambia, Zomig nasal spray (worked well)  Interval history 11/19/2016:  She has had 2 headaches with vision changes in the right eye and was seen at the ED for severe headache. Relpax helps along with cambia. She has 10 headache days a month and the following number of migraines(below). Mother is here who also has similar headaches. We discussed medications, she is toelrating propranolol well withh slowly increase. Discussed triptan's, Relpax appears to work but may wear off advised her to try Relpax with Cambia and zofran and also discussed Zomig (provided samples of nasal spray) and other triptan's and different ways to administer including nose sprays and powders and injectables. Also  discussed Cephaly device and other devices available on the market. Migraines: September 2  October 4 Nov 3 Dec 4    Tried: Topiramate and nortriptyline, now on propranolol  Interval history 07/09/2016:Imitrex not entirely working and Makes neck stiff, will try Relpax next. Weekends are worse. In June had 4 headaches days ranging from 4-6/10 in pain. In July she had 4 mild headaches 2-4/10, moderate 2 6-8/10 and had one very severe. They slowly progress but can get bad. She has had 4 in August one 7/10. She feels dehydration makes them worse. She has difficulty sleeping throughout the night. She has tried imitrex and ibuprofen and tylenol. She has a lot nausea and pills make her sick and difficulty swallowing. Discussed starting a daily preventative and also options for acute management. We'll start nortriptyline at night for prevention. We'll provide Relpax, Zofran and can be for acute management. Patient is to see me back in 4 months.   JIR:CVELFY [name] a 31 y.o.femalehere as a referral from Dr. Rhona Leavens headaches. Past medical history of asthma, Headaches started for most of her life since high school. Worsening over the last 5-6 years. They are more intense, has to lay down. Usually around the eyes, mostly around the temples. Usually more unilateral then switches to the other side. Throbbing behind the eyes. Light sensitivity, shuts the blinds, lays down with an ice pack. +nausea but no vomiting. She has them on the weekends. 10 headache days a month. 2-3 are migraines. Migraines last the whole day. Gradually gets stronger over a few hours, can get to a 7/10 in pain at its worst on average 3-5/10. Last all day, up to 24 hours.  No aura. No dizziness or vertigo or weakness or sensory changes. Mother, aunt, grandmother with migraines. No plans to get pregnant. No medication overuse. Takes Excedrin occ. Or Tylenol but no more than a few times a week. The non-migrainous headaches last  just a few hours and they are dull and not too painful. No blurry vision. No aura. No family history of headaches.  Reviewed notes, labs and imaging from outside physicians, which showed: Reviewed notes from OB/GYN. She was started on birth control pills for management of abnormal bleeding, her regulars were regular but she was having midcycle spotting. No significant dysmenorrhea. Reported she had problems on and up with headaches for some time that it never been evaluated. Not sexually active. No urinary or bowel complaints. Exam was negative, thyroid nonpalpable, breasts normal, lungs clear, regular rate and rhythm without murmurs or gallop, no carotid or abdominal bruits, abdominal exam benign, no masses or organomegaly or tenderness, normal external genitalia, cervix unremarkable, uterus and adnexa normal.  Review of Systems: Patient complains of symptoms per HPI as well as the following symptoms: No reported cardiac problems, no chest pain or shortness of breath, no fevers or systemic signs. Pertinent negatives per HPI. All others negative.   Social History   Social History  . Marital status: Single    Spouse name: N/A  . Number of children: 0  . Years of education: 12   Occupational History  . [employer]    Social History Main Topics  . Smoking status: Never Smoker  . Smokeless tobacco: Never Used  . Alcohol use No  . Drug use: No  . Sexual activity: Not on file     Comment: Stopped BC pills around March 2018   Other Topics Concern  . Not on file   Social History Narrative   Lives with mother and stepfather   Caffeine use: 1 coffee/day   1 soda per day    Family History  Problem Relation Age of Onset  . High blood pressure Mother   . High blood pressure Father   . Diabetes Mellitus II Maternal Grandmother   . Diabetes Mellitus I Maternal Grandfather   . Lung cancer Maternal Grandfather   . Stroke Paternal Grandfather   . Stroke Paternal Grandmother       Past Medical History:  Diagnosis Date  . GERD (gastroesophageal reflux disease)   . Vitreous detachment of right eye     Past Surgical History:  Procedure Laterality Date  . WISDOM TOOTH EXTRACTION  2004   x4    Current Outpatient Prescriptions  Medication Sig Dispense Refill  . acetaminophen (TYLENOL) 500 MG tablet Take 500 mg by mouth every 6 (six) hours as needed for headache.    . Diclofenac Potassium 50 MG PACK Take 50 mg by mouth once as needed. Take once daily as needed with headache onset. Please take with food 30 each 30  . ketorolac (TORADOL) 30 MG/ML injection Inject 30 mg into the vein once.    . ondansetron (ZOFRAN ODT) 4 MG disintegrating tablet Take 1-2 tablet (4-8 mg total) by mouth every 8 (eight) hours as needed for nausea or vomiting. 20 tablet 12  . PROAIR HFA 108 (90 Base) MCG/ACT inhaler Take 2 puffs by mouth as needed for wheezing or shortness of breath.   2  . 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  Topiramate ER (QUDEXY XR) 50 MG CS24 Take 50 mg by mouth at bedtime. 30 each 12  . triamcinolone cream (KENALOG) 0.1 % Apply 1 application topically as needed.    . zolmitriptan (ZOMIG) 5 MG nasal solution Place 1 spray into the nose as needed for migraine. May repeat in 2 hours. No more than 2 times in one day. 12 Units 11   No current facility-administered medications for this visit.     Allergies as of 04/13/2017 - Review Complete 04/13/2017  Allergen Reaction Noted  . Meloxicam Other (See Comments) 03/25/2016  . Sulfa antibiotics Hives 03/25/2016    Vitals: BP 124/85   Pulse 84   Ht 5\' 2"  (1.575 m)   Wt 136 lb 9.6 oz (62 kg)   BMI 24.98 kg/m  Last Weight:  Wt Readings from Last 1 Encounters:  04/13/17 136 lb 9.6 oz (62 kg)   Last Height:   Ht Readings from Last 1 Encounters:  04/13/17 5\' 2"  (1.575 m)   Physical exam: Exam: Gen: NAD,  conversant, well nourised, obese, well groomed                     CV: RRR, no MRG. No Carotid Bruits. No peripheral edema, warm, nontender Eyes: Conjunctivae clear without exudates or hemorrhage  Neuro: Detailed Neurologic Exam  Speech:    Speech is normal; fluent and spontaneous with normal comprehension.  Cognition:    The patient is oriented to person, place, and time;     recent and remote memory intact;     language fluent;     normal attention, concentration,     fund of knowledge Cranial Nerves:    The pupils are equal, round, and reactive to light. The fundi are normal and spontaneous venous pulsations are present. Visual fields are full to finger confrontation. Extraocular movements are intact. Trigeminal sensation is intact and the muscles of mastication are normal. The face is symmetric. The palate elevates in the midline. Hearing intact. Voice is normal. Shoulder shrug is normal. The tongue has normal motion without fasciculations.   Coordination:    Normal finger to nose and heel to shin. Normal rapid alternating movements.   Gait:    Heel-toe and tandem gait are normal.   Motor Observation:    No asymmetry, no atrophy, and no involuntary movements noted. Tone:    Normal muscle tone.    Posture:    Posture is normal. normal erect    Strength:    Strength is V/V in the upper and lower limbs.      Sensation: intact to LT     Reflex Exam:  DTR's:    Deep tendon reflexes in the upper and lower extremities are normal bilaterally.   Toes:    The toes are downgoing bilaterally.   Clonus:    Clonus is absent.       Assessment/Plan:  Assessment/Plan:31 year old female with migraines without aura without status migrainosus not intractable. Discussed preventative and acute management of migraines. Discussed lifestyle adjustments and watching for triggers, keeping a headache diary. Patient does not want to go on preventative medication at this time, can continue  with just acute management.   Tried: Topiramate IR and nortriptyline, propranolol, relpax, zofran, cambia, Zomig nasal spray  As far as your medications are concerned, I would like to suggest:  Zomig nasal : Please take one tablet at the onset of your headache. If it does not improve the symptoms please take one additional tablet  in 2 hours. Do not take more then 2 tablets in 24hrs. Do not take use more then 2 to 3 days in a week.  May take with zofran and cambia, do not take with other Triptans. Can try Frova which is a longer acting triptan next Discussed Botox for migraine. Let her borrow a Cephaly device for a few weeks, she returned did not help Patient has been seen at the emergency room in urgent care. Migraines are terminated by Toradol injections. Discussed Toradol, side effects, she has this for severe migraine do not use with cambia We'll start topiramate extended release 25 mg at night for 2 weeks then increase to 50 mg at night. Also discussed the new medication Erenumab and we will try this with her.   Discussed: There is increased risk for stroke in women with migraine with aura and a  Contraindication for the combined contraceptive pill for use by women who have migraine with aura, which is in line with World Health Organisation recommendations. The risk for women with migraine without aura is lower and other risk factors like smoking are far more likely to increase stroke risk than migraine. There is a recommendation for no smoking and for the use of low estrogen or progestogen only pills particularly for women with migraine with aura. It is important however that women with migraine who are taking the pill do not decide to suddenly stop taking it without discussing this with their doctor. Please discuss with her OB/GYN.  Sarina Ill, MD  [facility] [address] [place], [place] [address]  Phone [phone] Fax [phone]  A  total of 25 minutes was spent face-to-face with this patient. Over half this time was spent on counseling patient on the migraines diagnosis and different diagnostic and therapeutic options available.

## 2017-04-13 NOTE — Patient Instructions (Addendum)
Remember to drink plenty of fluid, eat healthy meals and do not skip any meals. Try to eat protein with a every meal and eat a healthy snack such as fruit or nuts in between meals. Try to keep a regular sleep-wake schedule and try to exercise daily, particularly in the form of walking, 20-30 minutes a day, if you can.   As far as your medications are concerned, I would like to suggest:  Zomig nasal spray at onset of migraine and may repeat once in 2 hours no more than twice in a day. Do not use with other triptans. May take with Zofran or Cambia or both.   I would like to see you back in 4 months, sooner if we need to. Please call us with any interim questions, concerns, problems, updates or refill requests.    Our phone number is [phone]. We also have an after hours call service for urgent matters and there is a physician on-call for urgent questions. For any emergencies you know to call 911 or go to the nearest emergency room  Topiramate extended-release capsules What is this medicine? TOPIRAMATE (toe PYRE a mate) is used to treat seizures in adults or children with epilepsy. It is also used for the prevention of migraine headaches. This medicine may be used for other purposes; ask your health care provider or pharmacist if you have questions. COMMON BRAND NAME(S): Trokendi XR What should I tell my health care provider before I take this medicine? They need to know if you have any of these conditions: -cirrhosis of the liver or liver disease -diarrhea -glaucoma -kidney stones or kidney disease -lung disease like asthma, obstructive pulmonary disease, emphysema -metabolic acidosis -on a ketogenic diet -scheduled for surgery or a procedure -suicidal thoughts, plans, or attempt; a previous suicide attempt by you or a family member -an unusual or allergic reaction to topiramate, other medicines, foods, dyes, or preservatives -pregnant or trying to get pregnant -breast-feeding How  should I use this medicine? Take this medicine by mouth with a glass of water. Follow the directions on the prescription label. Trokendi XR capsules must be swallowed whole. Do not sprinkle on food, break, crush, dissolve, or chew. Qudexy XR capsules may be swallowed whole or opened and sprinkled on a small amount of soft food. This mixture must be swallowed immediately. Do not chew or store mixture for later use. You may take this medicine with meals. Take your medicine at regular intervals. Do not take it more often than directed. Talk to your pediatrician regarding the use of this medicine in children. Special care may be needed. While Trokendi XR may be prescribed for children as young as 6 years and Qudexy XR may be prescribed for children as young as 2 years for selected conditions, precautions do apply. Overdosage: If you think you have taken too much of this medicine contact a poison control center or emergency room at once. NOTE: This medicine is only for you. Do not share this medicine with others. What if I miss a dose? If you miss a dose, take it as soon as you can. If it is almost time for your next dose, take only that dose. Do not take double or extra doses. What may interact with this medicine? Do not take this medicine with any of the following medications: -probenecid This medicine may also interact with the following medications: -acetazolamide -alcohol -amitriptyline -birth control pills -digoxin -hydrochlorothiazide -lithium -medicines for pain, sleep, or muscle relaxation -metformin -methazolamide -other seizure  or epilepsy medicines -pioglitazone -risperidone This list may not describe all possible interactions. Give your health care provider a list of all the medicines, herbs, non-prescription drugs, or dietary supplements you use. Also tell them if you smoke, drink alcohol, or use illegal drugs. Some items may interact with your medicine. What should I watch for  while using this medicine? Visit your doctor or health care professional for regular checks on your progress. Do not stop taking this medicine suddenly. This increases the risk of seizures if you are using this medicine to control epilepsy. Wear a medical identification bracelet or chain to say you have epilepsy or seizures, and carry a card that lists all your medicines. This medicine can decrease sweating and increase your body temperature. Watch for signs of deceased sweating or fever, especially in children. Avoid extreme heat, hot baths, and saunas. Be careful about exercising, especially in hot weather. Contact your health care provider right away if you notice a fever or decrease in sweating. You should drink plenty of fluids while taking this medicine. If you have had kidney stones in the past, this will help to reduce your chances of forming kidney stones. If you have stomach pain, with nausea or vomiting and yellowing of your eyes or skin, call your doctor immediately. You may get drowsy, dizzy, or have blurred vision. Do not drive, use machinery, or do anything that needs mental alertness until you know how this medicine affects you. To reduce dizziness, do not sit or stand up quickly, especially if you are an older patient. Alcohol can increase drowsiness and dizziness. Avoid alcoholic drinks. Do not drink alcohol for 6 hours before or 6 hours after taking Trokendi XR. If you notice blurred vision, eye pain, or other eye problems, seek medical attention at once for an eye exam. The use of this medicine may increase the chance of suicidal thoughts or actions. Pay special attention to how you are responding while on this medicine. Any worsening of mood, or thoughts of suicide or dying should be reported to your health care professional right away. This medicine may increase the chance of developing metabolic acidosis. If left untreated, this can cause kidney stones, bone disease, or slowed growth  in children. Symptoms include breathing fast, fatigue, loss of appetite, irregular heartbeat, or loss of consciousness. Call your doctor immediately if you experience any of these side effects. Also, tell your doctor about any surgery you plan on having while taking this medicine since this may increase your risk for metabolic acidosis. Birth control pills may not work properly while you are taking this medicine. Talk to your doctor about using an extra method of birth control. Women who become pregnant while using this medicine may enroll in the [place] Pregnancy Registry by calling [id number]. This registry collects information about the safety of antiepileptic drug use during pregnancy. What side effects may I notice from receiving this medicine? Side effects that you should report to your doctor or health care professional as soon as possible: -allergic reactions like skin rash, itching or hives, swelling of the face, lips, or tongue -decreased sweating and/or rise in body temperature -depression -difficulty breathing, fast or irregular breathing patterns -difficulty speaking -difficulty walking or controlling muscle movements -hearing impairment -redness, blistering, peeling or loosening of the skin, including inside the mouth -tingling, pain or numbness in the hands or feet -unusually weak or tired -worsening of mood, thoughts or actions of suicide or dying Side effects that usually do not  require medical attention (report to your doctor or health care professional if they continue or are bothersome): -altered taste -back pain, joint or muscle aches and pains -diarrhea, or constipation -headache -loss of appetite -nausea -stomach upset, indigestion -tremors This list may not describe all possible side effects. Call your doctor for medical advice about side effects. You may report side effects to FDA at 1-800-FDA-1088. Where should I keep my  medicine? Keep out of the reach of children. Store at room temperature between 15 and 30 degrees C (59 and 86 degrees F) in a tightly closed container. Protect from moisture. Throw away any unused medicine after the expiration date. NOTE: This sheet is a summary. It may not cover all possible information. If you have questions about this medicine, talk to your doctor, pharmacist, or health care provider.  2018 Elsevier/Gold Standard (2016-02-15 12:33:11)

## 2017-04-15 ENCOUNTER — Other Ambulatory Visit: Payer: Self-pay | Admitting: Obstetrics and Gynecology

## 2017-04-15 ENCOUNTER — Ambulatory Visit
Admission: RE | Admit: 2017-04-15 | Discharge: 2017-04-15 | Disposition: A | Discharge status: Home / Self Care | Payer: BLUE CROSS/BLUE SHIELD | Source: Ambulatory Visit | Attending: Obstetrics and Gynecology | Admitting: Obstetrics and Gynecology

## 2017-04-15 DIAGNOSIS — R928 Other abnormal and inconclusive findings on diagnostic imaging of breast: Secondary | ICD-10-CM

## 2017-04-15 DIAGNOSIS — N6489 Other specified disorders of breast: Secondary | ICD-10-CM | POA: Diagnosis not present

## 2017-04-15 DIAGNOSIS — N6320 Unspecified lump in the left breast, unspecified quadrant: Secondary | ICD-10-CM

## 2017-04-17 ENCOUNTER — Ambulatory Visit
Admission: RE | Admit: 2017-04-17 | Discharge: 2017-04-17 | Disposition: A | Discharge status: Home / Self Care | Payer: BLUE CROSS/BLUE SHIELD | Source: Ambulatory Visit | Attending: Obstetrics and Gynecology | Admitting: Obstetrics and Gynecology

## 2017-04-17 ENCOUNTER — Other Ambulatory Visit: Payer: Self-pay | Admitting: Obstetrics and Gynecology

## 2017-04-17 DIAGNOSIS — N6324 Unspecified lump in the left breast, lower inner quadrant: Secondary | ICD-10-CM | POA: Diagnosis not present

## 2017-04-17 DIAGNOSIS — N6320 Unspecified lump in the left breast, unspecified quadrant: Secondary | ICD-10-CM

## 2017-04-17 DIAGNOSIS — N6323 Unspecified lump in the left breast, lower outer quadrant: Secondary | ICD-10-CM | POA: Diagnosis not present

## 2017-04-17 DIAGNOSIS — D242 Benign neoplasm of left breast: Secondary | ICD-10-CM | POA: Diagnosis not present

## 2017-05-22 DIAGNOSIS — M533 Sacrococcygeal disorders, not elsewhere classified: Secondary | ICD-10-CM | POA: Diagnosis not present

## 2017-05-22 DIAGNOSIS — M545 Low back pain: Secondary | ICD-10-CM | POA: Diagnosis not present

## 2017-05-26 ENCOUNTER — Ambulatory Visit
Admission: RE | Admit: 2017-05-26 | Discharge: 2017-05-26 | Disposition: A | Discharge status: Home / Self Care | Payer: BLUE CROSS/BLUE SHIELD | Source: Ambulatory Visit | Attending: Physician Assistant | Admitting: Physician Assistant

## 2017-05-26 ENCOUNTER — Other Ambulatory Visit: Payer: Self-pay | Admitting: Physician Assistant

## 2017-05-26 DIAGNOSIS — M533 Sacrococcygeal disorders, not elsewhere classified: Secondary | ICD-10-CM

## 2017-07-06 DIAGNOSIS — M545 Low back pain: Secondary | ICD-10-CM | POA: Diagnosis not present

## 2017-07-06 DIAGNOSIS — M2560 Stiffness of unspecified joint, not elsewhere classified: Secondary | ICD-10-CM | POA: Diagnosis not present

## 2017-07-06 DIAGNOSIS — M461 Sacroiliitis, not elsewhere classified: Secondary | ICD-10-CM | POA: Diagnosis not present

## 2017-07-06 DIAGNOSIS — M6380 Disorders of muscle in diseases classified elsewhere, unspecified site: Secondary | ICD-10-CM | POA: Diagnosis not present

## 2017-07-08 DIAGNOSIS — M2560 Stiffness of unspecified joint, not elsewhere classified: Secondary | ICD-10-CM | POA: Diagnosis not present

## 2017-07-08 DIAGNOSIS — M545 Low back pain: Secondary | ICD-10-CM | POA: Diagnosis not present

## 2017-07-08 DIAGNOSIS — M6380 Disorders of muscle in diseases classified elsewhere, unspecified site: Secondary | ICD-10-CM | POA: Diagnosis not present

## 2017-07-08 DIAGNOSIS — M461 Sacroiliitis, not elsewhere classified: Secondary | ICD-10-CM | POA: Diagnosis not present

## 2017-07-14 DIAGNOSIS — M6380 Disorders of muscle in diseases classified elsewhere, unspecified site: Secondary | ICD-10-CM | POA: Diagnosis not present

## 2017-07-14 DIAGNOSIS — M545 Low back pain: Secondary | ICD-10-CM | POA: Diagnosis not present

## 2017-07-14 DIAGNOSIS — M2560 Stiffness of unspecified joint, not elsewhere classified: Secondary | ICD-10-CM | POA: Diagnosis not present

## 2017-07-14 DIAGNOSIS — M461 Sacroiliitis, not elsewhere classified: Secondary | ICD-10-CM | POA: Diagnosis not present

## 2017-07-16 DIAGNOSIS — M545 Low back pain: Secondary | ICD-10-CM | POA: Diagnosis not present

## 2017-07-16 DIAGNOSIS — M2560 Stiffness of unspecified joint, not elsewhere classified: Secondary | ICD-10-CM | POA: Diagnosis not present

## 2017-07-16 DIAGNOSIS — M6380 Disorders of muscle in diseases classified elsewhere, unspecified site: Secondary | ICD-10-CM | POA: Diagnosis not present

## 2017-07-16 DIAGNOSIS — M461 Sacroiliitis, not elsewhere classified: Secondary | ICD-10-CM | POA: Diagnosis not present

## 2017-07-20 DIAGNOSIS — M461 Sacroiliitis, not elsewhere classified: Secondary | ICD-10-CM | POA: Diagnosis not present

## 2017-07-20 DIAGNOSIS — M2560 Stiffness of unspecified joint, not elsewhere classified: Secondary | ICD-10-CM | POA: Diagnosis not present

## 2017-07-20 DIAGNOSIS — M6380 Disorders of muscle in diseases classified elsewhere, unspecified site: Secondary | ICD-10-CM | POA: Diagnosis not present

## 2017-07-20 DIAGNOSIS — M545 Low back pain: Secondary | ICD-10-CM | POA: Diagnosis not present

## 2017-07-22 DIAGNOSIS — M2560 Stiffness of unspecified joint, not elsewhere classified: Secondary | ICD-10-CM | POA: Diagnosis not present

## 2017-07-22 DIAGNOSIS — M6380 Disorders of muscle in diseases classified elsewhere, unspecified site: Secondary | ICD-10-CM | POA: Diagnosis not present

## 2017-07-22 DIAGNOSIS — M545 Low back pain: Secondary | ICD-10-CM | POA: Diagnosis not present

## 2017-07-22 DIAGNOSIS — M461 Sacroiliitis, not elsewhere classified: Secondary | ICD-10-CM | POA: Diagnosis not present

## 2017-07-28 ENCOUNTER — Other Ambulatory Visit: Payer: Self-pay | Admitting: Neurology

## 2017-08-03 DIAGNOSIS — M461 Sacroiliitis, not elsewhere classified: Secondary | ICD-10-CM | POA: Diagnosis not present

## 2017-08-03 DIAGNOSIS — M2560 Stiffness of unspecified joint, not elsewhere classified: Secondary | ICD-10-CM | POA: Diagnosis not present

## 2017-08-03 DIAGNOSIS — M545 Low back pain: Secondary | ICD-10-CM | POA: Diagnosis not present

## 2017-08-03 DIAGNOSIS — M6380 Disorders of muscle in diseases classified elsewhere, unspecified site: Secondary | ICD-10-CM | POA: Diagnosis not present

## 2017-08-05 DIAGNOSIS — M2560 Stiffness of unspecified joint, not elsewhere classified: Secondary | ICD-10-CM | POA: Diagnosis not present

## 2017-08-05 DIAGNOSIS — M461 Sacroiliitis, not elsewhere classified: Secondary | ICD-10-CM | POA: Diagnosis not present

## 2017-08-05 DIAGNOSIS — M6380 Disorders of muscle in diseases classified elsewhere, unspecified site: Secondary | ICD-10-CM | POA: Diagnosis not present

## 2017-08-05 DIAGNOSIS — M545 Low back pain: Secondary | ICD-10-CM | POA: Diagnosis not present

## 2017-08-10 DIAGNOSIS — M461 Sacroiliitis, not elsewhere classified: Secondary | ICD-10-CM | POA: Diagnosis not present

## 2017-08-10 DIAGNOSIS — M545 Low back pain: Secondary | ICD-10-CM | POA: Diagnosis not present

## 2017-08-10 DIAGNOSIS — M2560 Stiffness of unspecified joint, not elsewhere classified: Secondary | ICD-10-CM | POA: Diagnosis not present

## 2017-08-10 DIAGNOSIS — M6380 Disorders of muscle in diseases classified elsewhere, unspecified site: Secondary | ICD-10-CM | POA: Diagnosis not present

## 2017-08-17 ENCOUNTER — Encounter: Payer: Self-pay | Admitting: Neurology

## 2017-08-17 ENCOUNTER — Ambulatory Visit (INDEPENDENT_AMBULATORY_CARE_PROVIDER_SITE_OTHER): Payer: BLUE CROSS/BLUE SHIELD | Admitting: Neurology

## 2017-08-17 DIAGNOSIS — G43009 Migraine without aura, not intractable, without status migrainosus: Secondary | ICD-10-CM | POA: Diagnosis not present

## 2017-08-17 DIAGNOSIS — R110 Nausea: Secondary | ICD-10-CM | POA: Diagnosis not present

## 2017-08-17 MED ORDER — ZOLMITRIPTAN 5 MG NA SOLN: 1.0000 | NASAL | 11 refills | Status: DC | PRN

## 2017-08-17 MED ORDER — ONDANSETRON 4 MG PO TBDP: ORAL_TABLET | ORAL | 12 refills | Status: DC

## 2017-08-17 NOTE — Progress Notes (Signed)
[facility]    Provider:  Dr Jaynee Eagles Referring Provider: Maury Dus, MD Primary Care Physician:  Maury Dus, MD  CC: Headaches  Interval history 08/17/2017:  She uses zomig, works faster nasally, cambia afterwards helps if more severe. In one hour the migraine has resolved. Zomig and cambia work best. She is getting both zomig and cambia. Severity is better with Aimovig. No side effects. She had some numbness and tingling several times. Zofran helps with nausea. Also helps with nausea on non-headache days. She has not started the Qudexy. Will wait and see how Aimovig works. Will hold for now and wait and see. The migraines are not as intense, increased severity and frequency.   June 2 migraine days of headaches July 7 migraine days August 3 migraine days Sep 5 migraine days   Interval history 04/13/2017:  Patient is here for follow-up of migraines. She has failed multiple medications as below. She is using relpax and zofran for acute management and may also use a cambia if needed. She did not tolerate propranolol. She has side effects to medications. With Nortriptyline she did not sleep well and was anxious. The Toradol shots work well. But no preventative has worked.  Will try Qudexy and also Erenumab. She has 15 headache days a month and superimposed migraines as below. Discussed teratogenicity of topiramate do not get pregnant. We'll see her back in 4 months. She is to email me so we can slowly increase the topiramate extended release. Stop for any side effects or email me.  January: 5 migraines necessitating Relpax. February 2 migraines, March had 5 migraines and 6 headaches needing treatment of other kind, May 9 migraines necessitating relpax and a few others where she took tylenol or excedin. No medication overuse.  Tried: Topiramate IR and nortriptyline(do not tolerate), propranolol(did not tolerate), relpax(works), zofran, cambia, Zomig nasal spray (worked  well)  Interval history 11/19/2016: She has had 2 headaches with vision changes in the right eye and was seen at the ED for severe headache. Relpax helps along with cambia. She has 10 headache days a month and the following number of migraines(below). Mother is here who also has similar headaches. We discussed medications, she is toelrating propranolol well withh slowly increase. Discussed triptan's, Relpax appears to work but may wear off advised her to try Relpax with Cambia and zofran and also discussed Zomig (provided samples of nasal spray) and other triptan's and different ways to administer including nosesprays and powders and injectables. Also discussed Cephaly device and other devices available on the market. Migraines: September 2  October 4 Nov 3 Dec 4    Tried: Topiramate and nortriptyline, now on propranolol  Interval history 07/09/2016:Imitrex not entirely working and Makes neck stiff, will try Relpax next. Weekends are worse. In June had 4 headaches days ranging from 4-6/10 in pain. In July she had 4 mild headaches 2-4/10, moderate 2 6-8/10 and had one very severe. They slowly progress but can get bad. She has had 4 in August one 7/10. She feels dehydration makes them worse. She has difficulty sleeping throughout the night. She has tried imitrex and ibuprofen and tylenol. She has a lot nausea and pills make her sick and difficulty swallowing. Discussed starting a daily preventative and also options for acute management. We'll start nortriptyline at night for prevention. We'll provide Relpax, Zofran and can be for acute management. Patient is to see me back in 4 months.   DZH:GDJMEQ [name] a 31 y.o.femalehere as a referral from Dr.  McCombfor headaches. Past medical history of asthma, Headaches started for most of her life since high school. Worsening over the last 5-6 years. They are more intense, has to lay down. Usually around the eyes, mostly around the temples.  Usually more unilateral then switches to the other side. Throbbing behind the eyes. Light sensitivity, shuts the blinds, lays down with an ice pack. +nausea but no vomiting. She has them on the weekends. 10 headache days a month. 2-3 are migraines. Migraines last the whole day. Gradually gets stronger over a few hours, can get to a 7/10 in pain at its worst on average 3-5/10. Last all day, up to 24 hours. No aura. No dizziness or vertigo or weakness or sensory changes. Mother, aunt, grandmother with migraines. No plans to get pregnant. No medication overuse. Takes Excedrin occ. Or Tylenol but no more than a few times a week. The non-migrainous headaches last just a few hours and they are dull and not too painful. No blurry vision. No aura. No family history of headaches.  Reviewed notes, labs and imaging from outside physicians, which showed: Reviewed notes from OB/GYN. She was started on birth control pills for management of abnormal bleeding, her regulars were regular but she was having midcycle spotting. No significant dysmenorrhea. Reported she had problems on and up with headaches for some time that it never been evaluated. Not sexually active. No urinary or bowel complaints. Exam was negative, thyroid nonpalpable, breasts normal, lungs clear, regular rate and rhythm without murmurs or gallop, no carotid or abdominal bruits, abdominal exam benign, no masses or organomegaly or tenderness, normal external genitalia, cervix unremarkable, uterus and adnexa normal.  Review of Systems: Patient complains of symptoms per HPI as well as the following symptoms: No reported cardiac problems, no chest pain or shortness of breath, no fevers or systemic signs. Pertinent negatives per HPI. All others negative.  Social History   Social History  . Marital status: Single    Spouse name: N/A  . Number of children: 0  . Years of education: 95   Occupational History  . [employer]    Social History  Main Topics  . Smoking status: Never Smoker  . Smokeless tobacco: Never Used  . Alcohol use No  . Drug use: No  . Sexual activity: Not on file     Comment: Stopped BC pills around March 2018   Other Topics Concern  . Not on file   Social History Narrative   Lives with mother and stepfather   Caffeine use: 1 coffee/day   1 soda per day    Family History  Problem Relation Age of Onset  . High blood pressure Mother   . High blood pressure Father   . Diabetes Mellitus II Maternal Grandmother   . Diabetes Mellitus I Maternal Grandfather   . Lung cancer Maternal Grandfather   . Stroke Paternal Grandfather   . Stroke Paternal Grandmother   . Breast cancer Paternal Aunt     Past Medical History:  Diagnosis Date  . 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 GERD (gastroesophageal reflux disease)   . Vitreous detachment of right eye     Past Surgical History:  Procedure Laterality Date  . BREAST BIOPSY Left 04/2017  . WISDOM TOOTH EXTRACTION  2004   x4    Current Outpatient Prescriptions  Medication Sig Dispense Refill  . acetaminophen (TYLENOL) 500 MG tablet Take 500 mg by mouth every 6 (six) hours as needed for headache.    Marland Kitchen  CAMBIA 50 MG PACK MIX 1 PACKET WITH 1-2 OUNCES OF WATER. DRINK IMMEDIATELY AS A SINGLE DOSE 30 each 30  . 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 ketorolac (TORADOL) 30 MG/ML injection Inject 30 mg into the vein once.    . ondansetron (ZOFRAN ODT) 4 MG disintegrating tablet Take 1-2 tablet (4-8 mg total) by mouth every 8 (eight) hours as needed for nausea or vomiting. 20 tablet 12  . PROAIR HFA 108 (90 Base) MCG/ACT inhaler Take 2 puffs by mouth as needed for wheezing or shortness of breath.   2  . Topiramate ER (QUDEXY XR) 50 MG CS24 Take 50 mg by mouth at bedtime. 30 each 12  . triamcinolone cream (KENALOG) 0.1 % Apply 1 application topically as needed.      . zolmitriptan (ZOMIG) 5 MG nasal solution Place 1 spray into the nose as needed for migraine. May repeat in 2 hours. No more than 2 times in one day. 12 Units 11   No current facility-administered medications for this visit.     Allergies as of 08/17/2017 - Review Complete 08/17/2017  Allergen Reaction Noted  . Meloxicam Other (See Comments) 03/25/2016  . Sulfa antibiotics Hives 03/25/2016    Vitals: BP 130/76   Pulse 96   Ht 5\' 2"  (1.575 m)   Wt 137 lb 12.8 oz (62.5 kg)   BMI 25.20 kg/m  Last Weight:  Wt Readings from Last 1 Encounters:  08/17/17 137 lb 12.8 oz (62.5 kg)   Last Height:   Ht Readings from Last 1 Encounters:  08/17/17 5\' 2"  (1.575 m)    Physical exam: Exam: Gen: NAD, conversant, well nourised, obese, well groomed                     CV: RRR, no MRG. No Carotid Bruits. No peripheral edema, warm, nontender Eyes: Conjunctivae clear without exudates or hemorrhage  Neuro: Detailed Neurologic Exam  Speech:    Speech is normal; fluent and spontaneous with normal comprehension.  Cognition:    The patient is oriented to person, place, and time;     recent and remote memory intact;     language fluent;     normal attention, concentration,     fund of knowledge Cranial Nerves:    The pupils are equal, round, and reactive to light. The fundi are normal and spontaneous venous pulsations are present. Visual fields are full to finger confrontation. Extraocular movements are intact. Trigeminal sensation is intact and the muscles of mastication are normal. The face is symmetric. The palate elevates in the midline. Hearing intact. Voice is normal. Shoulder shrug is normal. The tongue has normal motion without fasciculations.   Coordination:    Normal finger to nose and heel to shin. Normal rapid alternating movements.   Gait:    Heel-toe and tandem gait are normal.   Motor Observation:    No asymmetry, no atrophy, and no involuntary movements noted. Tone:     Normal muscle tone.    Posture:    Posture is normal. normal erect    Strength:    Strength is V/V in the upper and lower limbs.      Sensation: intact to LT     Reflex Exam:  DTR's:    Deep tendon reflexes in the upper and lower extremities are normal bilaterally.   Toes:  The toes are downgoing bilaterally.   Clonus:    Clonus is absent.  Assessment/Plan:31 year old female with migraines without aura without status migrainosus not intractable. Discussed preventative and acute management of migraines. Discussed lifestyle adjustments and watching for triggers, keeping a headache diary. Patient does not want to go on preventative medication at this time, can continue with just acute management.  Patiwnt is doing well on her acute management of zomig, zofran and cambia. Resolves migraine within an hour. Continue  Aimovig: feels imporoved in severity, completed 3 months will continue. Injections hurt, advised to try ehr abdomen instead of thighs.  Hold on Topiramate ER at this time   Tried: Topiramate IR and nortriptyline, propranolol, relpax, zofran, cambia, Zomig nasal spray  As far as your medications are concerned, I would like to suggest:  Zomig nasal : Please take one tablet at the onset of your headache. If it does not improve the symptoms please take one additional tablet in 2 hours. Do not take more then 2 tablets in 24hrs. Do not take use more then 2 to 3 days in a week.  May take with zofran and cambia, do not take with other Triptans. Can try Frova which is a longer acting triptan next Discussed Botox for migraine.   Discussed Toradol, side effects, she has this for severe migraine do not use with cambia Hold fo rnoe on topiramate extended release 25 mg at night for 2 weeks then increase to 50 mg at night.   Discussed: There is increased risk for stroke in women with migraine with aura and a Contraindication for the combined contraceptive pill for use by  women who have migraine with aura, which is in line with World Health Organisation recommendations. The risk for women with migraine without aura is lower and other risk factors like smoking are far more likely to increase stroke risk than migraine. There is a recommendation for no smoking and for the use of low estrogen or progestogen only pills particularly for women with migraine with aura. It is important however that women with migraine who are taking the pill do not decide to suddenly stop taking it without discussing this with their doctor. Please discuss with her OB/GYN.  Discussed: To prevent or relieve headaches, try the following: Cool Compress. Lie down and place a cool compress on your head.  Avoid headache triggers. If certain foods or odors seem to have triggered your migraines in the past, avoid them. A headache diary might help you identify triggers.  Include physical activity in your daily routine. Try a daily walk or other moderate aerobic exercise.  Manage stress. Find healthy ways to cope with the stressors, such as delegating tasks on your to-do list.  Practice relaxation techniques. Try deep breathing, yoga, massage and visualization.  Eat regularly. Eating regularly scheduled meals and maintaining a healthy diet might help prevent headaches. Also, drink plenty of fluids.  Follow a regular sleep schedule. Sleep deprivation might contribute to headaches Consider biofeedback. With this mind-body technique, you learn to control certain bodily functions - such as muscle tension, heart rate and blood pressure - to prevent headaches or reduce headache pain.    Proceed to emergency room if you experience new or worsening symptoms or symptoms do not resolve, if you have new neurologic symptoms or if headache is severe, or for any concerning symptom.   Provided education and documentation from American headache Society toolbox including articles on: chronic migraine medication overuse  headache, chronic migraines, prevention of migraines,  behavioral and other nonpharmacologic treatments for headache.  Laura Ill, MD  [facility] [address] [place], [place] [address]  Phone [phone] Fax [phone]  A total of 15 minutes was spent face-to-face with this patient. Over half this time was spent on counseling patient on the migraines diagnosis and different diagnostic and therapeutic options available.

## 2017-08-19 ENCOUNTER — Other Ambulatory Visit: Payer: Self-pay

## 2017-08-19 MED ORDER — ERENUMAB-AOOE 70 MG/ML ~~LOC~~ SOAJ: 1.0000 "pen " | SUBCUTANEOUS | 11 refills | Status: DC

## 2017-09-01 ENCOUNTER — Telehealth: Payer: Self-pay

## 2017-09-01 NOTE — Telephone Encounter (Signed)
I received an prior auth request for her aimovig. I have completed and submitted the PA and should have a determination within 48-72 hours.

## 2017-09-04 NOTE — Telephone Encounter (Signed)
PA approved. Effective from 09/01/2017 through 11/30/2017

## 2017-09-07 DIAGNOSIS — L719 Rosacea, unspecified: Secondary | ICD-10-CM | POA: Diagnosis not present

## 2017-09-10 NOTE — Telephone Encounter (Signed)
Called [facility] pharmacy on [address] and confirmed that patient will be able to get the [place]. LVM on pt's phone informing her of the above.

## 2017-11-18 DIAGNOSIS — I781 Nevus, non-neoplastic: Secondary | ICD-10-CM | POA: Diagnosis not present

## 2017-11-18 DIAGNOSIS — L719 Rosacea, unspecified: Secondary | ICD-10-CM | POA: Diagnosis not present

## 2017-12-31 DIAGNOSIS — H3589 Other specified retinal disorders: Secondary | ICD-10-CM | POA: Diagnosis not present

## 2018-01-13 DIAGNOSIS — L719 Rosacea, unspecified: Secondary | ICD-10-CM | POA: Diagnosis not present

## 2018-01-13 DIAGNOSIS — I781 Nevus, non-neoplastic: Secondary | ICD-10-CM | POA: Diagnosis not present

## 2018-02-15 ENCOUNTER — Ambulatory Visit: Payer: BLUE CROSS/BLUE SHIELD | Admitting: Neurology

## 2018-02-15 ENCOUNTER — Encounter: Payer: Self-pay | Admitting: Neurology

## 2018-02-15 VITALS — BP 114/70 | HR 87 | Ht 62.0 in | Wt 139.8 lb

## 2018-02-15 DIAGNOSIS — G43709 Chronic migraine without aura, not intractable, without status migrainosus: Secondary | ICD-10-CM | POA: Diagnosis not present

## 2018-02-15 MED ORDER — ERENUMAB-AOOE 70 MG/ML ~~LOC~~ SOAJ: 70.0000 mg | SUBCUTANEOUS | 0 refills | Status: DC

## 2018-02-15 MED ORDER — ERENUMAB-AOOE 70 MG/ML ~~LOC~~ SOAJ: 140.0000 mg | SUBCUTANEOUS | 11 refills | Status: DC

## 2018-02-15 MED ORDER — DICLOFENAC POTASSIUM(MIGRAINE) 50 MG PO PACK: PACK | ORAL | 0 refills | Status: DC

## 2018-02-15 NOTE — Progress Notes (Signed)
[facility]    Provider:  Dr Jaynee Eagles Referring Provider: Maury Dus, MD Primary Care Physician:  Maury Dus, MD  CC: Headaches  Interval history: She is on Aimovig and doing well.  Since starting Aimovig, severity and frequency decreased, no severe headaches. And less headaches. Discussed options, daily meds, increase Aimovig. Continue acute management.   January: 5 migraine days and 4 headache days February: 4 migraine days March: 3 migraine days   Tried: Topiramate IR and nortriptyline, propranolol, relpax, cambia, zofran, Aimovig, zomig, toradol injections  Interval history 08/17/2017:  She uses zomig, works faster nasally, [place] afterwards helps if more severe. In one hour the migraine has resolved. Zomig and cambia work best. She is getting both zomig and cambia. Severity is better with Aimovig. No side effects. She had some numbness and tingling several times. Zofran helps with nausea. Also helps with nausea on non-headache days. She has not started the Qudexy. Will wait and see how Aimovig works. Will hold for now and wait and see. The migraines are not as intense, increased severity and frequency.   June 2 migraine days of headaches July 7 migraine days August 3 migraine days Sep 5 migraine days   Interval history 04/13/2017:  Patient is here for follow-up of migraines. She has failed multiple medications as below. She is using relpax and zofran for acute management and may also use a cambia if needed. She did not tolerate propranolol. She has side effects to medications. With Nortriptyline she did not sleep well and was anxious. The Toradol shots work well. But no preventative has worked.  Will try Qudexy and also Erenumab. She has 15 headache days a month and superimposed migraines as below. Discussed teratogenicity of topiramate do not get pregnant. We'll see her back in 4 months. She is to email me so we can slowly increase the topiramate extended  release. Stop for any side effects or email me.  January: 5 migraines necessitating Relpax. February 2 migraines, March had 5 migraines and 6 headaches needing treatment of other kind, May 9 migraines necessitating relpax and a few others where she took tylenol or excedin. No medication overuse.  Tried: Topiramate IR and nortriptyline(do not tolerate), propranolol(did not tolerate), relpax(works), zofran, cambia, Zomig nasal spray (worked well)  Interval history 11/19/2016: She has had 2 headaches with vision changes in the right eye and was seen at the ED for severe headache. Relpax helps along with cambia. She has 10 headache days a month and the following number of migraines(below). Mother is here who also has similar headaches. We discussed medications, she is toelrating propranolol well withh slowly increase. Discussed triptan's, Relpax appears to work but may wear off advised her to try Relpax with Cambia and zofran and also discussed Zomig (provided samples of nasal spray) and other triptan's and different ways to administer including nosesprays and powders and injectables. Also discussed Cephaly device and other devices available on the market. Migraines: September 2  October 4 Nov 3 Dec 4    Tried: Topiramate and nortriptyline, now on propranolol  Interval history 07/09/2016:Imitrex not entirely working and Makes neck stiff, will try Relpax next. Weekends are worse. In June had 4 headaches days ranging from 4-6/10 in pain. In July she had 4 mild headaches 2-4/10, moderate 2 6-8/10 and had one very severe. They slowly progress but can get bad. She has had 4 in August one 7/10. She feels dehydration makes them worse. She has difficulty sleeping throughout the night. She has tried imitrex  and ibuprofen and tylenol. She has a lot nausea and pills make her sick and difficulty swallowing. Discussed starting a daily preventative and also options for acute management. We'll start  nortriptyline at night for prevention. We'll provide Relpax, Zofran and can be for acute management. Patient is to see me back in 4 months.   OZD:GUYQIH [name] a 32 y.o.femalehere as a referral from Dr. Rhona Leavens headaches. Past medical history of asthma, Headaches started for most of her life since high school. Worsening over the last 5-6 years. They are more intense, has to lay down. Usually around the eyes, mostly around the temples. Usually more unilateral then switches to the other side. Throbbing behind the eyes. Light sensitivity, shuts the blinds, lays down with an ice pack. +nausea but no vomiting. She has them on the weekends. 10 headache days a month. 2-3 are migraines. Migraines last the whole day. Gradually gets stronger over a few hours, can get to a 7/10 in pain at its worst on average 3-5/10. Last all day, up to 24 hours. No aura. No dizziness or vertigo or weakness or sensory changes. Mother, aunt, grandmother with migraines. No plans to get pregnant. No medication overuse. Takes Excedrin occ. Or Tylenol but no more than a few times a week. The non-migrainous headaches last just a few hours and they are dull and not too painful. No blurry vision. No aura. No family history of headaches.  Reviewed notes, labs and imaging from outside physicians, which showed: Reviewed notes from OB/GYN. She was started on birth control pills for management of abnormal bleeding, her regulars were regular but she was having midcycle spotting. No significant dysmenorrhea. Reported she had problems on and up with headaches for some time that it never been evaluated. Not sexually active. No urinary or bowel complaints. Exam was negative, thyroid nonpalpable, breasts normal, lungs clear, regular rate and rhythm without murmurs or gallop, no carotid or abdominal bruits, abdominal exam benign, no masses or organomegaly or tenderness, normal external genitalia, cervix unremarkable, uterus and adnexa  normal.  Review of Systems: Patient complains of symptoms per HPI as well as the following symptoms: No reported cardiac problems, no chest pain or shortness of breath, no fevers or systemic signs. Pertinent negatives per HPI. All others negative.  Social History   Socioeconomic History  . Marital status: Single    Spouse name: Not on file  . Number of children: 0  . Years of education: 73  . Highest education level: Not on file  Occupational History  . Occupation: Technical [employer]  Social Needs  . Financial resource strain: Not on file  . Food insecurity:    Worry: Not on file    Inability: Not on file  . Transportation needs:    Medical: Not on file    Non-medical: Not on file  Tobacco Use  . Smoking status: Never Smoker  . Smokeless tobacco: Never Used  Substance and Sexual Activity  . Alcohol use: No    Alcohol/week: 0.0 oz  . Drug use: No  . Sexual activity: Not on file    Comment: Stopped BC pills around March 2018  Lifestyle  . Physical activity:    Days per week: Not on file    Minutes per session: Not on file  . Stress: Not on file  Relationships  . Social connections:    Talks on phone: Not on file    Gets together: Not on file    Attends religious service: Not on file  Active member of club or organization: Not on file    Attends meetings of clubs or organizations: Not on file    Relationship status: Not on file  . Intimate partner violence:    Fear of current or ex partner: Not on file    Emotionally abused: Not on file    Physically abused: Not on file    Forced sexual activity: Not on file  Other Topics Concern  . Not on file  Social History Narrative   Lives with mother and stepfather   Caffeine use: 1 coffee/day   1 soda per day    Family History  Problem Relation Age of Onset  . High blood pressure Mother   . High blood pressure Father   . Diabetes Mellitus II Maternal Grandmother   . Diabetes Mellitus I Maternal Grandfather   .  Lung cancer Maternal Grandfather   . Stroke Paternal Grandfather   . Stroke Paternal Grandmother   . Breast cancer Paternal Aunt     Past Medical History:  Diagnosis Date  . 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 GERD (gastroesophageal reflux disease)   . Vitreous detachment of right eye     Past Surgical History:  Procedure Laterality Date  . BREAST BIOPSY Left 04/2017  . WISDOM TOOTH EXTRACTION  2004   x4    Current Outpatient Medications  Medication Sig Dispense Refill  . acetaminophen (TYLENOL) 500 MG tablet Take 500 mg by mouth every 6 (six) hours as needed for headache.    . CAMBIA 50 MG PACK MIX 1 PACKET WITH 1-2 OUNCES OF WATER. DRINK IMMEDIATELY AS A SINGLE DOSE 30 each 30  . doxycycline (VIBRAMYCIN) 50 MG capsule Take 50 mg by mouth every other day.    Eduard Roux (AIMOVIG) 70 MG/ML SOAJ Inject 1 pen into the skin every 30 (thirty) days. 1 pen 11  . ketorolac (TORADOL) 30 MG/ML injection Inject 30 mg into the vein once.    . ondansetron (ZOFRAN ODT) 4 MG disintegrating tablet Take 1-2 tablet (4-8 mg total) by mouth every 8 (eight) hours as needed for nausea or vomiting. 20 tablet 12  . PROAIR HFA 108 (90 Base) MCG/ACT inhaler Take 2 puffs by mouth as needed for wheezing or shortness of breath.   2  . zolmitriptan (ZOMIG) 5 MG nasal solution Place 1 spray into the nose as needed for migraine. May repeat in 2 hours. No more than 2 times in one day. 12 Units 11   No current facility-administered medications for this visit.     Allergies as of 02/15/2018 - Review Complete 02/15/2018  Allergen Reaction Noted  . Meloxicam Other (See Comments) 03/25/2016  . Sulfa antibiotics Hives 03/25/2016    Vitals: BP 114/70   Pulse 87   Ht 5\' 2"  (1.575 m)   Wt 139 lb 12.8 oz (63.4 kg)   BMI 25.57 kg/m  Last Weight:  Wt Readings from Last 1 Encounters:  02/15/18 139 lb 12.8 oz (63.4 kg)   Last Height:   Ht Readings from Last 1 Encounters:  02/15/18 5\' 2"  (1.575 m)     Physical exam: Exam: Gen: NAD, conversant, well nourised, obese, well groomed                     CV: RRR, no MRG. No Carotid Bruits. No peripheral edema, warm, nontender Eyes: Conjunctivae clear without exudates or hemorrhage  Neuro: Detailed Neurologic Exam  Speech:    Speech is normal; fluent and spontaneous  with normal comprehension.  Cognition:    The patient is oriented to person, place, and time;     recent and remote memory intact;     language fluent;     normal attention, concentration,     fund of knowledge Cranial Nerves:    The pupils are equal, round, and reactive to light. The fundi are normal and spontaneous venous pulsations are present. Visual fields are full to finger confrontation. Extraocular movements are intact. Trigeminal sensation is intact and the muscles of mastication are normal. The face is symmetric. The palate elevates in the midline. Hearing intact. Voice is normal. Shoulder shrug is normal. The tongue has normal motion without fasciculations.   Coordination:    Normal finger to nose and heel to shin. Normal rapid alternating movements.   Gait:    Heel-toe and tandem gait are normal.   Motor Observation:    No asymmetry, no atrophy, and no involuntary movements noted. Tone:    Normal muscle tone.    Posture:    Posture is normal. normal erect    Strength:    Strength is V/V in the upper and lower limbs.      Sensation: intact to LT     Reflex Exam:  DTR's:    Deep tendon reflexes in the upper and lower extremities are normal bilaterally.   Toes:    The toes are downgoing bilaterally.   Clonus:    Clonus is absent.  Assessment/Plan:32 year old female with migraines without aura without status migrainosus not intractable. Discussed preventative and acute management of migraines. Discussed lifestyle adjustments and watching for triggers, keeping a headache diary.   Patiwnt is doing well on her acute management of zomig nasal  spray, zofran and cambia. Also has toradol injections if needed. Resolves migraine within an hour. Continue  Aimovig: feels improved in severity, increase to the 140mg  dose  Tried: Topiramate IR and nortriptyline, propranolol, relpax, zofran, cambia, Zomig nasal spray  As far as your medications are concerned, I would like to suggest:  Zomig nasal : Please take at the onset of your headache. If it does not improve the symptoms please take one additional dose in 2 hours. Do not take more then 2 doses in 24hrs. Do not take use more then 2 to 3 days in a week.  May take with zofran and cambia, do not take with other Triptans. Can try Frova which is a longer acting triptan next Discussed Botox for migraine. Will increase Aimovig.   Discussed Toradol, side effects, she has this for severe migraine do not use with cambia   Discussed again: There is increased risk for stroke in women with migraine with aura and a Contraindication for the combined contraceptive pill for use by women who have migraine with aura, which is in line with World Health Organisation recommendations. The risk for women with migraine without aura is lower and other risk factors like smoking are far more likely to increase stroke risk than migraine. There is a recommendation for no smoking and for the use of low estrogen or progestogen only pills particularly for women with migraine with aura. It is important however that women with migraine who are taking the pill do not decide to suddenly stop taking it without discussing this with their doctor. Please discuss with her OB/GYN.  Discussed again: To prevent or relieve headaches, try the following: Cool Compress. Lie down and place a cool compress on your head.  Avoid headache triggers. If certain foods or  odors seem to have triggered your migraines in the past, avoid them. A headache diary might help you identify triggers.  Include physical activity in your daily routine.  Try a daily walk or other moderate aerobic exercise.  Manage stress. Find healthy ways to cope with the stressors, such as delegating tasks on your to-do list.  Practice relaxation techniques. Try deep breathing, yoga, massage and visualization.  Eat regularly. Eating regularly scheduled meals and maintaining a healthy diet might help prevent headaches. Also, drink plenty of fluids.  Follow a regular sleep schedule. Sleep deprivation might contribute to headaches Consider biofeedback. With this mind-body technique, you learn to control certain bodily functions - such as muscle tension, heart rate and blood pressure - to prevent headaches or reduce headache pain.    Proceed to emergency room if you experience new or worsening symptoms or symptoms do not resolve, if you have new neurologic symptoms or if headache is severe, or for any concerning symptom.   Provided education and documentation from American headache Society toolbox including articles on: chronic migraine medication overuse headache, chronic migraines, prevention of migraines, behavioral and other nonpharmacologic treatments for headache.  Sarina Ill, MD  [facility] [address] [place], [place] [address]  Phone [phone] Fax [phone]  A total of 25  minutes was spent face-to-face with this patient. Over half this time was spent on counseling patient on the migraines diagnosis and different diagnostic and therapeutic options available.

## 2018-02-17 ENCOUNTER — Telehealth: Payer: Self-pay

## 2018-02-17 NOTE — Telephone Encounter (Signed)
We received a prior authorization request for this medication. I have completed and submitted the PA on Cover My Meds and should have a determination within 48-72 hours.  Cover My Meds Key: [id number]

## 2018-02-22 NOTE — Telephone Encounter (Addendum)
Aimovig 140 mg was approved by [employer] from 02/16/18 through 02/17/19.  Called pt's pharmacy and spoke with Verdis Frederickson. She stated that they were already aware of the approval of Aimovig. The medication is ready for pt pickup with a $5 copay. The pt was notified by pharmacy.

## 2018-03-09 DIAGNOSIS — Z13220 Encounter for screening for lipoid disorders: Secondary | ICD-10-CM | POA: Diagnosis not present

## 2018-03-09 DIAGNOSIS — J9801 Acute bronchospasm: Secondary | ICD-10-CM | POA: Diagnosis not present

## 2018-03-09 DIAGNOSIS — Z0000 Encounter for general adult medical examination without abnormal findings: Secondary | ICD-10-CM | POA: Diagnosis not present

## 2018-03-09 DIAGNOSIS — R232 Flushing: Secondary | ICD-10-CM | POA: Diagnosis not present

## 2018-03-09 DIAGNOSIS — Z23 Encounter for immunization: Secondary | ICD-10-CM | POA: Diagnosis not present

## 2018-03-09 DIAGNOSIS — G43009 Migraine without aura, not intractable, without status migrainosus: Secondary | ICD-10-CM | POA: Diagnosis not present

## 2018-03-22 DIAGNOSIS — J029 Acute pharyngitis, unspecified: Secondary | ICD-10-CM | POA: Diagnosis not present

## 2018-03-22 DIAGNOSIS — J069 Acute upper respiratory infection, unspecified: Secondary | ICD-10-CM | POA: Diagnosis not present

## 2018-04-12 DIAGNOSIS — L719 Rosacea, unspecified: Secondary | ICD-10-CM | POA: Diagnosis not present

## 2018-04-14 DIAGNOSIS — Z01419 Encounter for gynecological examination (general) (routine) without abnormal findings: Secondary | ICD-10-CM | POA: Diagnosis not present

## 2018-04-14 DIAGNOSIS — Z6824 Body mass index (BMI) 24.0-24.9, adult: Secondary | ICD-10-CM | POA: Diagnosis not present

## 2018-06-08 IMAGING — CT CT HEAD W/O CM
3 series · 15 of 47 positions shown, 18 images · non-contrast
Comparison: None.

CLINICAL DATA: Right-sided headache and facial numbness over the
last day.

EXAM:
CT HEAD WITHOUT CONTRAST
TECHNIQUE: Contiguous axial images were obtained from the base of the skull
through the vertex without intravenous contrast.

[Series 2: head 5.0 h30s · axial · 0.41mm/px · z∈[-79,+51]mm · 9 of 32 slices shown, 12 images]
[im 3/32  brain]
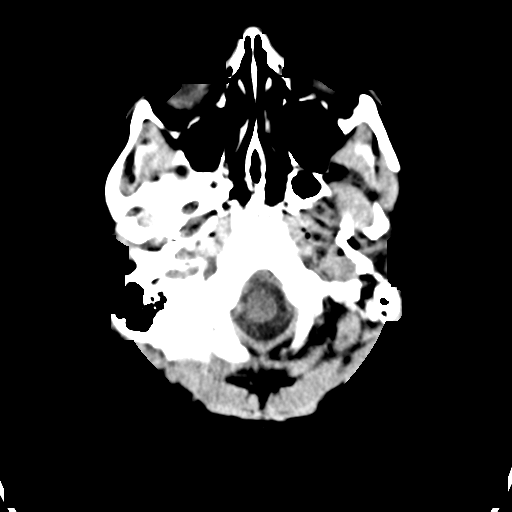
[im 3/32  bone]
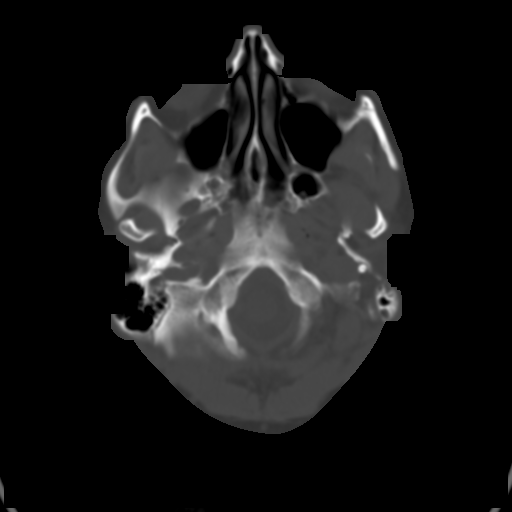
[im 6/32  brain]
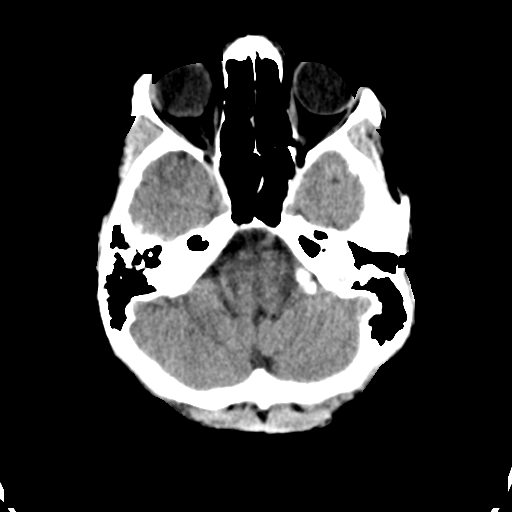
[im 9/32  brain]
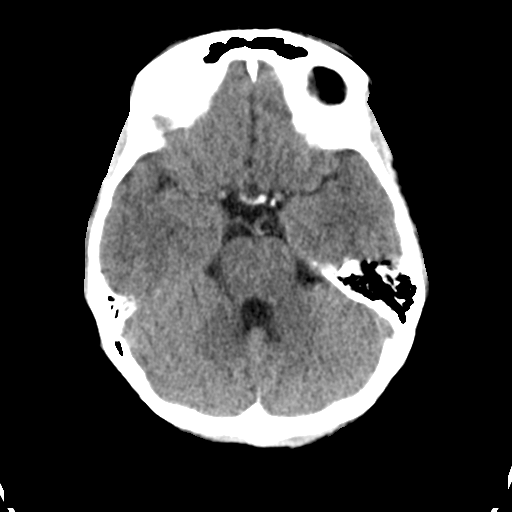
[im 12/32  brain]
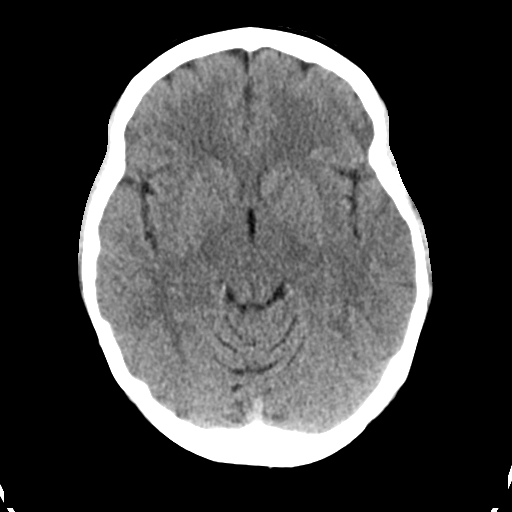
[im 17/32  brain]
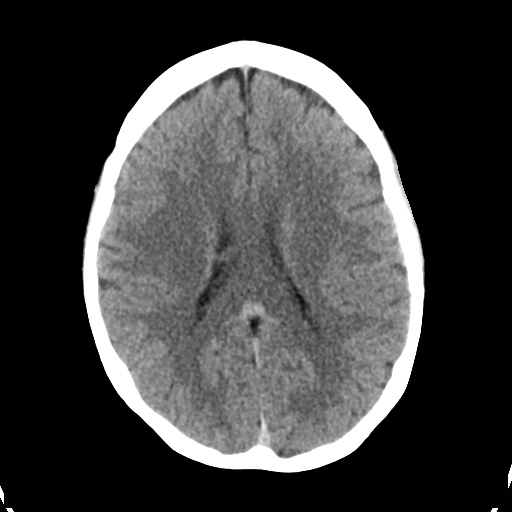
[im 17/32  bone]
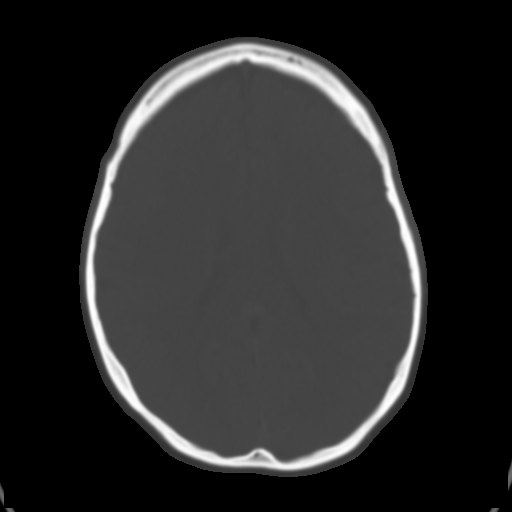
[im 20/32  brain]
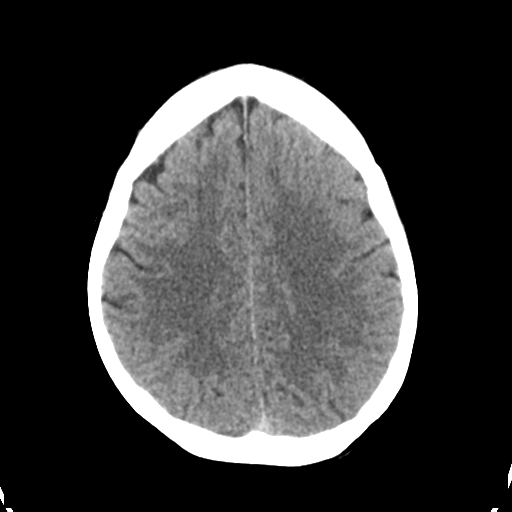
[im 23/32  brain]
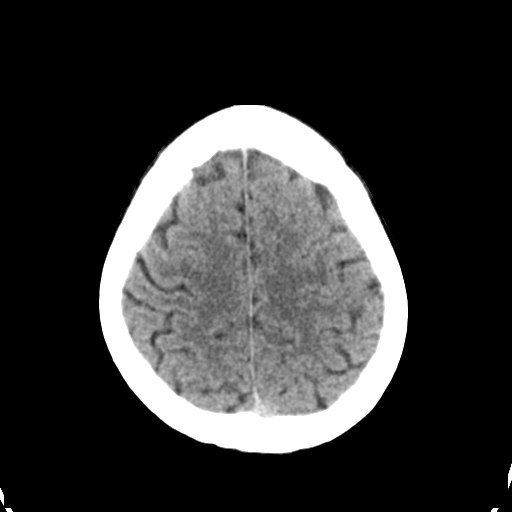
[im 26/32  brain]
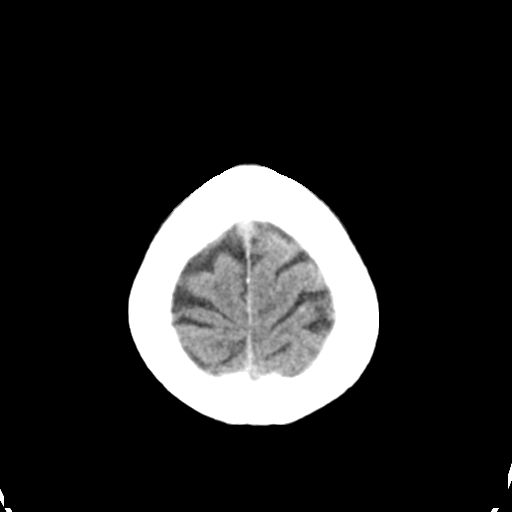
[im 29/32  brain]
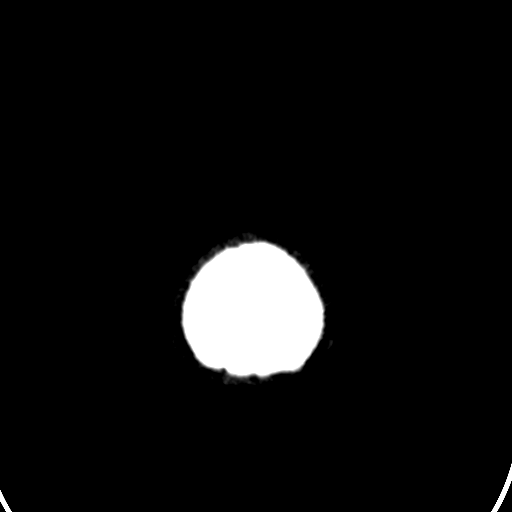
[im 29/32  bone]
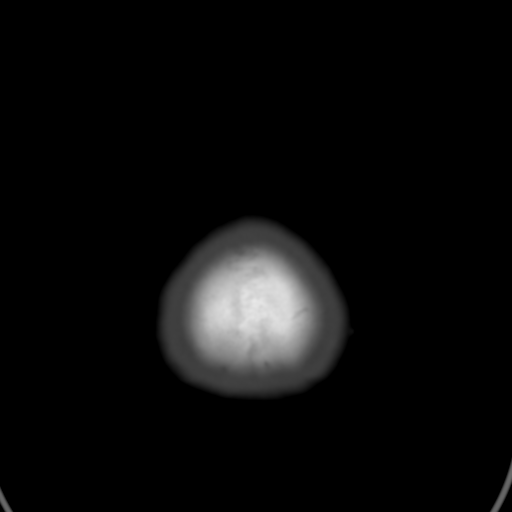

[Series 4: head 3.0 mpr cor · coronal · 0.29mm/px · 3 of 67 slices shown]
[im 23/67  brain]
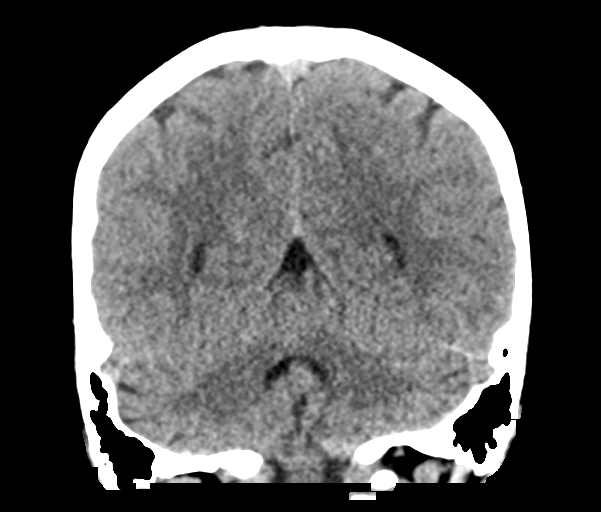
[im 30/67  brain]
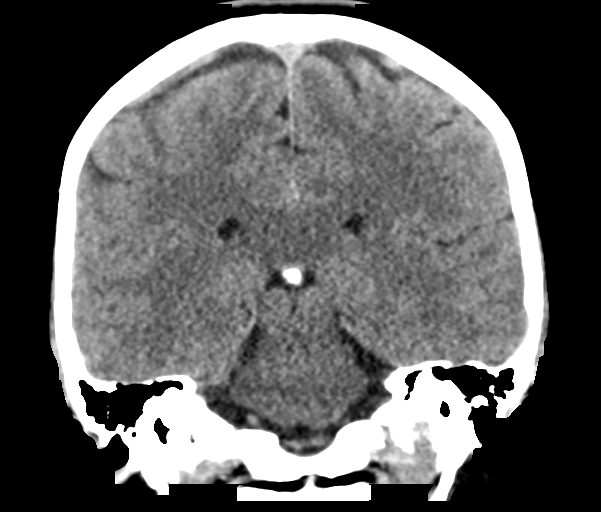
[im 37/67  brain]
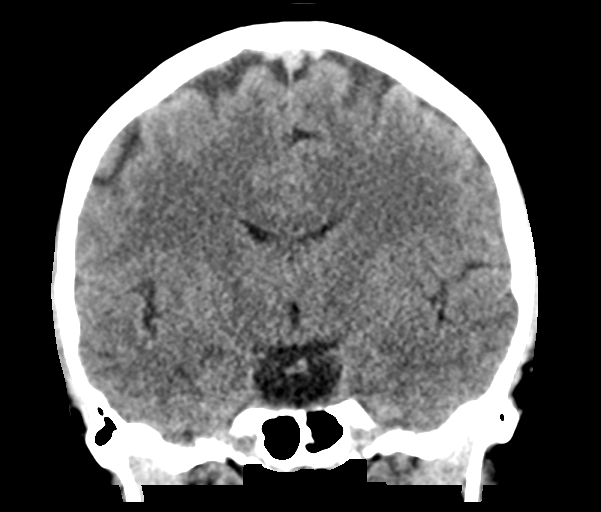

[Series 5: head 3.0 mpr sag · sagittal · 0.31mm/px · 3 of 57 slices shown]
[im 19/57  brain]
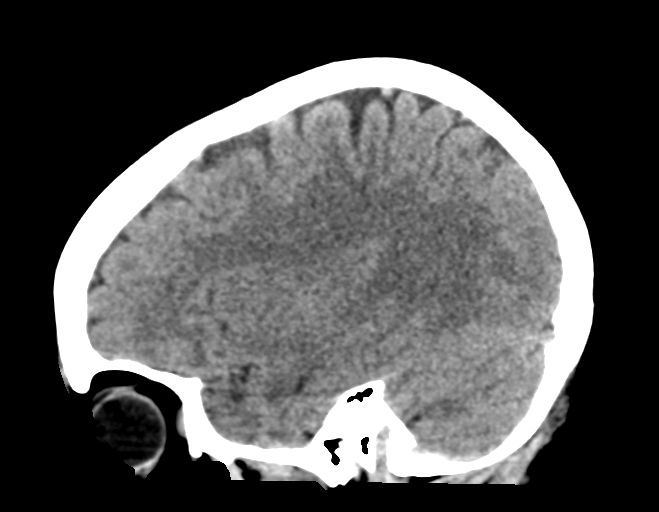
[im 29/57  brain]
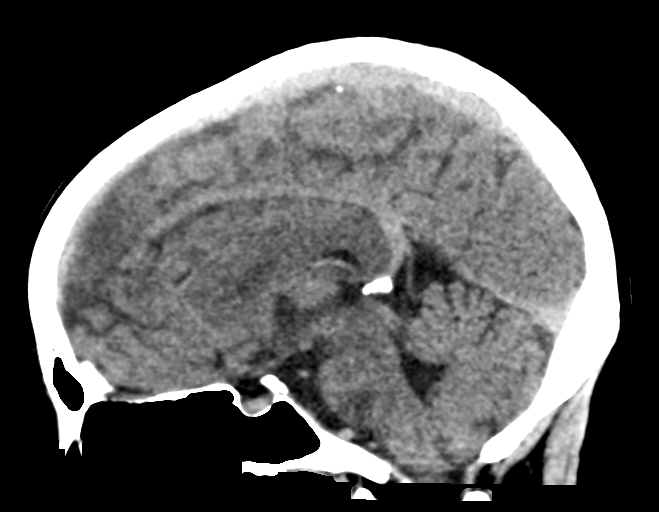
[im 38/57  brain]
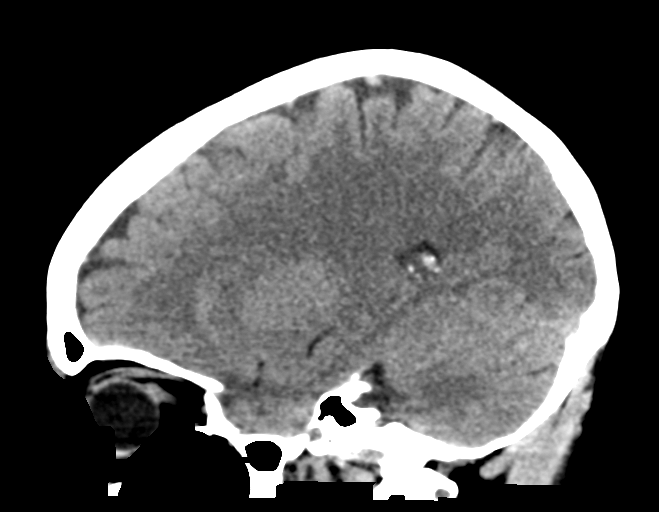

[15 of 47 positions shown; findings below may reference images not displayed]

FINDINGS: Brain: No evidence of malformation, atrophy, old or acute small or
large vessel infarction, mass lesion, hemorrhage, hydrocephalus or
extra-axial collection. No evidence of pituitary lesion.

Vascular: No vascular calcification.  No hyperdense vessels.

Skull: Normal.  No fracture or focal bone lesion.

Sinuses/Orbits: Visualized sinuses are clear. No fluid in the middle
ears or mastoids. Visualized orbits are normal.

Other: None significant
IMPRESSION: Normal head CT.

## 2018-07-21 ENCOUNTER — Telehealth: Payer: Self-pay | Admitting: *Deleted

## 2018-07-21 NOTE — Telephone Encounter (Signed)
Faxed clinical note, ICD 10 code and insurance info/demographics to [place] per their request for [place] PA completion. Received a receipt of confirmation.

## 2018-07-26 NOTE — Telephone Encounter (Signed)
Received notice from [place] that Cambia 50 mg has been denied d/t pt having not tried at least two different oral anti-inflammatory medications one of which must be diclofenac tablets or capsules. [place] fortunately has a program in place where pt will still receive medication for $20 despite insurance denial.   Reference # [id number]

## 2018-08-03 ENCOUNTER — Other Ambulatory Visit: Payer: Self-pay | Admitting: Neurology

## 2018-08-27 DIAGNOSIS — Z23 Encounter for immunization: Secondary | ICD-10-CM | POA: Diagnosis not present

## 2018-09-23 ENCOUNTER — Other Ambulatory Visit: Payer: Self-pay | Admitting: Neurology

## 2018-09-28 DIAGNOSIS — M549 Dorsalgia, unspecified: Secondary | ICD-10-CM | POA: Diagnosis not present

## 2018-09-28 DIAGNOSIS — R42 Dizziness and giddiness: Secondary | ICD-10-CM | POA: Diagnosis not present

## 2018-10-28 DIAGNOSIS — L719 Rosacea, unspecified: Secondary | ICD-10-CM | POA: Diagnosis not present

## 2018-11-01 DIAGNOSIS — D72819 Decreased white blood cell count, unspecified: Secondary | ICD-10-CM | POA: Diagnosis not present

## 2018-11-12 ENCOUNTER — Other Ambulatory Visit: Payer: Self-pay | Admitting: Neurology

## 2018-11-12 MED ORDER — ERENUMAB-AOOE 140 MG/ML ~~LOC~~ SOAJ: 140.0000 mg | SUBCUTANEOUS | 11 refills | Status: DC

## 2018-11-12 NOTE — Telephone Encounter (Signed)
E-scribed refill as requested. 

## 2018-11-12 NOTE — Addendum Note (Signed)
Addended by: Hope Pigeon on: 11/12/2018 10:41 AM   Modules accepted: Orders

## 2018-11-12 NOTE — Telephone Encounter (Signed)
Pt is asking for a refill on her Erenumab-aooe (AIMOVIG 140 DOSE) 70 MG/ML SOAJ [id number]

## 2019-01-04 DIAGNOSIS — M549 Dorsalgia, unspecified: Secondary | ICD-10-CM | POA: Diagnosis not present

## 2019-02-10 ENCOUNTER — Telehealth: Payer: Self-pay

## 2019-02-10 ENCOUNTER — Telehealth: Payer: Self-pay | Admitting: Neurology

## 2019-02-10 NOTE — Telephone Encounter (Signed)
Due to current COVID 19 pandemic, our office is severely reducing in office visits for at least the next 2 weeks, in order to minimize the risk to our patients and healthcare providers. Pt understands that although there may be some limitations with this type of visit, we will take all precautions to reduce any security or privacy concerns.  Pt understands that this will be treated like an in office visit and we will file with pt's insurance, and there may be a patient responsible charge related to this service. Pt's email is [email or web address]. Pt understands that the cisco webex software must be downloaded and operational on the device pt plans to use for the visit.

## 2019-02-10 NOTE — Telephone Encounter (Signed)
Noted thanks °

## 2019-02-10 NOTE — Telephone Encounter (Signed)
Pending approval for Zomig Nasal Spray 5 mg Solution. Key- [id number] ICD 10 code: G7.909 Tried medications: Topiramate IR and nortriptyline, propranolol, relpax, cambia, zofran, Aimovig, zomig, toradol injections.    I was able to receive a instant approval. Approved 02/10/2019 through 02/08/2022. Once I receive the approval letter a copy will be faxed to her pharmacy.

## 2019-02-10 NOTE — Telephone Encounter (Signed)
I spoke with the patient and advised that she can download and activate a new savings card on [email or web address]. Also advised there is a financial assistance program that she can look into online if she does not have adequate medication coverage. Pt verbalized appreciation. Pt is due for a f/u. She is interesting in telemedicine. Pt understands she will be contacted by a staff member get set up for a virtual visit or telephone visit.

## 2019-02-10 NOTE — Telephone Encounter (Signed)
Pt is needing a refill on her ZOMIG 5 MG nasal solution but the pharmacy[facility] [address], ran her savings card and it is not working and medication is to expensive to pay out of pocket. Pt would like to know if its because the medications RX has expired or is there anything that can be done about this. Please advise.

## 2019-02-14 ENCOUNTER — Ambulatory Visit (INDEPENDENT_AMBULATORY_CARE_PROVIDER_SITE_OTHER): Payer: BLUE CROSS/BLUE SHIELD | Admitting: Family Medicine

## 2019-02-14 ENCOUNTER — Encounter: Payer: Self-pay | Admitting: Family Medicine

## 2019-02-14 ENCOUNTER — Other Ambulatory Visit: Payer: Self-pay

## 2019-02-14 DIAGNOSIS — G43009 Migraine without aura, not intractable, without status migrainosus: Secondary | ICD-10-CM | POA: Diagnosis not present

## 2019-02-14 NOTE — Patient Instructions (Signed)
Topiramate (Qudexy) extended-release capsules What is this medicine? TOPIRAMATE (toe PYRE a mate) is used to treat seizures in adults or children with epilepsy. It is also used for the prevention of migraine headaches. This medicine may be used for other purposes; ask your health care provider or pharmacist if you have questions. COMMON BRAND NAME(S): Trokendi XR, Qudexy What should I tell my health care provider before I take this medicine? They need to know if you have any of these conditions: -cirrhosis of the liver or liver disease -diarrhea -glaucoma -kidney stones or kidney disease -lung disease like asthma, obstructive pulmonary disease, emphysema -metabolic acidosis -on a ketogenic diet -scheduled for surgery or a procedure -suicidal thoughts, plans, or attempt; a previous suicide attempt by you or a family member -an unusual or allergic reaction to topiramate, other medicines, foods, dyes, or preservatives -pregnant or trying to get pregnant -breast-feeding How should I use this medicine? Take this medicine by mouth with a glass of water. Follow the directions on the prescription label. Trokendi XR capsules must be swallowed whole. Do not sprinkle on food, break, crush, dissolve, or chew. Qudexy XR capsules may be swallowed whole or opened and sprinkled on a small amount of soft food. This mixture must be swallowed immediately. Do not chew or store mixture for later use. You may take this medicine with meals. Take your medicine at regular intervals. Do not take it more often than directed. Talk to your pediatrician regarding the use of this medicine in children. Special care may be needed. While Trokendi XR may be prescribed for children as young as 6 years and Qudexy XR may be prescribed for children as young as 2 years for selected conditions, precautions do apply. Overdosage: If you think you have taken too much of this medicine contact a poison control center or emergency room  at once. NOTE: This medicine is only for you. Do not share this medicine with others. What if I miss a dose? If you miss a dose, take it as soon as you can. If it is almost time for your next dose, take only that dose. Do not take double or extra doses. What may interact with this medicine? Do not take this medicine with any of the following medications: -probenecid This medicine may also interact with the following medications: -acetazolamide -alcohol -amitriptyline -birth control pills -digoxin -hydrochlorothiazide -lithium -medicines for pain, sleep, or muscle relaxation -metformin -methazolamide -other seizure or epilepsy medicines -pioglitazone -risperidone This list may not describe all possible interactions. Give your health care provider a list of all the medicines, herbs, non-prescription drugs, or dietary supplements you use. Also tell them if you smoke, drink alcohol, or use illegal drugs. Some items may interact with your medicine. What should I watch for while using this medicine? Visit your doctor or health care professional for regular checks on your progress. Do not stop taking this medicine suddenly. This increases the risk of seizures if you are using this medicine to control epilepsy. Wear a medical identification bracelet or chain to say you have epilepsy or seizures, and carry a card that lists all your medicines. This medicine can decrease sweating and increase your body temperature. Watch for signs of deceased sweating or fever, especially in children. Avoid extreme heat, hot baths, and saunas. Be careful about exercising, especially in hot weather. Contact your health care provider right away if you notice a fever or decrease in sweating. You should drink plenty of fluids while taking this medicine. If you  have had kidney stones in the past, this will help to reduce your chances of forming kidney stones. If you have stomach pain, with nausea or vomiting and yellowing  of your eyes or skin, call your doctor immediately. You may get drowsy, dizzy, or have blurred vision. Do not drive, use machinery, or do anything that needs mental alertness until you know how this medicine affects you. To reduce dizziness, do not sit or stand up quickly, especially if you are an older patient. Alcohol can increase drowsiness and dizziness. Avoid alcoholic drinks. Do not drink alcohol for 6 hours before or 6 hours after taking Trokendi XR. If you notice blurred vision, eye pain, or other eye problems, seek medical attention at once for an eye exam. The use of this medicine may increase the chance of suicidal thoughts or actions. Pay special attention to how you are responding while on this medicine. Any worsening of mood, or thoughts of suicide or dying should be reported to your health care professional right away. This medicine may increase the chance of developing metabolic acidosis. If left untreated, this can cause kidney stones, bone disease, or slowed growth in children. Symptoms include breathing fast, fatigue, loss of appetite, irregular heartbeat, or loss of consciousness. Call your doctor immediately if you experience any of these side effects. Also, tell your doctor about any surgery you plan on having while taking this medicine since this may increase your risk for metabolic acidosis. Birth control pills may not work properly while you are taking this medicine. Talk to your doctor about using an extra method of birth control. Women who become pregnant while using this medicine may enroll in the [place] Pregnancy Registry by calling [phone]. This registry collects information about the safety of antiepileptic drug use during pregnancy. What side effects may I notice from receiving this medicine? Side effects that you should report to your doctor or health care professional as soon as possible: -allergic reactions like skin rash, itching or  hives, swelling of the face, lips, or tongue -decreased sweating and/or rise in body temperature -depression -difficulty breathing, fast or irregular breathing patterns -difficulty speaking -difficulty walking or controlling muscle movements -hearing impairment -redness, blistering, peeling or loosening of the skin, including inside the mouth -tingling, pain or numbness in the hands or feet -unusually weak or tired -worsening of mood, thoughts or actions of suicide or dying Side effects that usually do not require medical attention (report to your doctor or health care professional if they continue or are bothersome): -altered taste -back pain, joint or muscle aches and pains -diarrhea, or constipation -headache -loss of appetite -nausea -stomach upset, indigestion -tremors This list may not describe all possible side effects. Call your doctor for medical advice about side effects. You may report side effects to FDA at 1-800-FDA-1088. Where should I keep my medicine? Keep out of the reach of children. Store at room temperature between 15 and 30 degrees C (59 and 86 degrees F) in a tightly closed container. Protect from moisture. Throw away any unused medicine after the expiration date. NOTE: This sheet is a summary. It may not cover all possible information. If you have questions about this medicine, talk to your doctor, pharmacist, or health care provider.  2019 [employer]/Gold Standard (2016-02-15 12:33:11)   Venlafaxine (Effexor) tablets What is this medicine? VENLAFAXINE (VEN la fax een) is used to treat depression, anxiety and panic disorder. This medicine may be used for other purposes; ask your health care provider  or pharmacist if you have questions. COMMON BRAND NAME(S): Effexor What should I tell my health care provider before I take this medicine? They need to know if you have any of these conditions: -bleeding disorders -glaucoma -heart disease -high blood  pressure -high cholesterol -kidney disease -liver disease -low levels of sodium in the blood -mania or bipolar disorder -seizures -suicidal thoughts, plans, or attempt; a previous suicide attempt by you or a family -take medicines that treat or prevent blood clots -thyroid disease -an unusual or allergic reaction to venlafaxine, desvenlafaxine, other medicines, foods, dyes, or preservatives -pregnant or trying to get pregnant -breast-feeding How should I use this medicine? Take this medicine by mouth with a glass of water. Follow the directions on the prescription label. Take it with food. Take your medicine at regular intervals. Do not take your medicine more often than directed. Do not stop taking this medicine suddenly except upon the advice of your doctor. Stopping this medicine too quickly may cause serious side effects or your condition may worsen. A special MedGuide will be given to you by the pharmacist with each prescription and refill. Be sure to read this information carefully each time. Talk to your pediatrician regarding the use of this medicine in children. Special care may be needed. Overdosage: If you think you have taken too much of this medicine contact a poison control center or emergency room at once. NOTE: This medicine is only for you. Do not share this medicine with others. What if I miss a dose? If you miss a dose, take it as soon as you can. If it is almost time for your next dose, take only that dose. Do not take double or extra doses. What may interact with this medicine? Do not take this medicine with any of the following medications: -certain medicines for fungal infections like fluconazole, itraconazole, ketoconazole, posaconazole, voriconazole -cisapride -desvenlafaxine -dofetilide -dronedarone -duloxetine -levomilnacipran -linezolid -MAOIs like Carbex, Eldepryl, Marplan, Nardil, and Parnate -methylene blue (injected into a  vein) -milnacipran -pimozide -thioridazine -ziprasidone This medicine may also interact with the following medications: -amphetamines -aspirin and aspirin-like medicines -certain medicines for depression, anxiety, or psychotic disturbances -certain medicines for migraine headaches like almotriptan, eletriptan, frovatriptan, naratriptan, rizatriptan, sumatriptan, zolmitriptan -certain medicines for sleep -certain medicines that treat or prevent blood clots like dalteparin, enoxaparin, warfarin -cimetidine -clozapine -diuretics -fentanyl -furazolidone -indinavir -isoniazid -lithium -metoprolol -NSAIDS, medicines for pain and inflammation, like ibuprofen or naproxen -other medicines that prolong the QT interval (cause an abnormal heart rhythm) -procarbazine -rasagiline -supplements like St. John's wort, kava kava, valerian -tramadol -tryptophan This list may not describe all possible interactions. Give your health care provider a list of all the medicines, herbs, non-prescription drugs, or dietary supplements you use. Also tell them if you smoke, drink alcohol, or use illegal drugs. Some items may interact with your medicine. What should I watch for while using this medicine? Tell your doctor if your symptoms do not get better or if they get worse. Visit your doctor or health care professional for regular checks on your progress. Because it may take several weeks to see the full effects of this medicine, it is important to continue your treatment as prescribed by your doctor. Patients and their families should watch out for new or worsening thoughts of suicide or depression. Also watch out for sudden changes in feelings such as feeling anxious, agitated, panicky, irritable, hostile, aggressive, impulsive, severely restless, overly excited and hyperactive, or not being able to sleep. If this happens, especially  at the beginning of treatment or after a change in dose, call your health care  professional. This medicine can cause an increase in blood pressure. Check with your doctor for instructions on monitoring your blood pressure while taking this medicine. You may get drowsy or dizzy. Do not drive, use machinery, or do anything that needs mental alertness until you know how this medicine affects you. Do not stand or sit up quickly, especially if you are an older patient. This reduces the risk of dizzy or fainting spells. Alcohol may interfere with the effect of this medicine. Avoid alcoholic drinks. Your mouth may get dry. Chewing sugarless gum, sucking hard candy and drinking plenty of water will help. Contact your doctor if the problem does not go away or is severe. What side effects may I notice from receiving this medicine? Side effects that you should report to your doctor or health care professional as soon as possible: -allergic reactions like skin rash, itching or hives, swelling of the face, lips, or tongue -anxious -breathing problems -confusion -changes in vision -chest pain -confusion -elevated mood, decreased need for sleep, racing thoughts, impulsive behavior -eye pain -fast, irregular heartbeat -feeling faint or lightheaded, falls -feeling agitated, angry, or irritable -hallucination, loss of contact with reality -high blood pressure -loss of balance or coordination -palpitations -redness, blistering, peeling or loosening of the skin, including inside the mouth -restlessness, pacing, inability to keep still -seizures -stiff muscles -suicidal thoughts or other mood changes -trouble passing urine or change in the amount of urine -trouble sleeping -unusual bleeding or bruising -unusually weak or tired -vomiting Side effects that usually do not require medical attention (report to your doctor or health care professional if they continue or are bothersome): -change in sex drive or performance -change in appetite or weight -constipation -dizziness -dry  mouth -headache -increased sweating -nausea -tired This list may not describe all possible side effects. Call your doctor for medical advice about side effects. You may report side effects to FDA at 1-800-FDA-1088. Where should I keep my medicine? Keep out of the reach of children. Store at a controlled temperature between 20 and 25 degrees C (68 and 77 degrees F), in a dry place. Throw away any unused medicine after the expiration date. NOTE: This sheet is a summary. It may not cover all possible information. If you have questions about this medicine, talk to your doctor, pharmacist, or health care provider.  2019 Elsevier/Gold Standard (2016-03-27 18:42:26)  OnabotulinumtoxinA injection (Medical Use) What is this medicine? ONABOTULINUMTOXINA (o na BOTT you lye num tox in eh) is a neuro-muscular blocker. This medicine is used to treat crossed eyes, eyelid spasms, severe neck muscle spasms, ankle and toe muscle spasms, and elbow, wrist, and finger muscle spasms. It is also used to treat excessive underarm sweating, to prevent chronic migraine headaches, and to treat loss of bladder control due to neurologic conditions such as multiple sclerosis or spinal cord injury. This medicine may be used for other purposes; ask your health care provider or pharmacist if you have questions. COMMON BRAND NAME(S): Botox What should I tell my health care provider before I take this medicine? They need to know if you have any of these conditions: -breathing problems -cerebral palsy spasms -difficulty urinating -heart problems -history of surgery where this medicine is going to be used -infection at the site where this medicine is going to be used -myasthenia gravis or other neurologic disease -nerve or muscle disease -surgery plans -take medicines that treat or prevent  blood clots -thyroid problems -an unusual or allergic reaction to botulinum toxin, albumin, other medicines, foods, dyes, or  preservatives -pregnant or trying to get pregnant -breast-feeding How should I use this medicine? This medicine is for injection into a muscle. It is given by a health care professional in a hospital or clinic setting. Talk to your pediatrician regarding the use of this medicine in children. While this drug may be prescribed for children as young as 25 years old for selected conditions, precautions do apply. Overdosage: If you think you have taken too much of this medicine contact a poison control center or emergency room at once. NOTE: This medicine is only for you. Do not share this medicine with others. What if I miss a dose? This does not apply. What may interact with this medicine? -aminoglycoside antibiotics like gentamicin, neomycin, tobramycin -muscle relaxants -other botulinum toxin injections This list may not describe all possible interactions. Give your health care provider a list of all the medicines, herbs, non-prescription drugs, or dietary supplements you use. Also tell them if you smoke, drink alcohol, or use illegal drugs. Some items may interact with your medicine. What should I watch for while using this medicine? Visit your doctor for regular check ups. This medicine will cause weakness in the muscle where it is injected. Tell your doctor if you feel unusually weak in other muscles. Get medical help right away if you have problems with breathing, swallowing, or talking. This medicine might make your eyelids droop or make you see blurry or double. If you have weak muscles or trouble seeing do not drive a car, use machinery, or do other dangerous activities. This medicine contains albumin from human blood. It may be possible to pass an infection in this medicine, but no cases have been reported. Talk to your doctor about the risks and benefits of this medicine. If your activities have been limited by your condition, go back to your regular routine slowly after treatment with  this medicine. What side effects may I notice from receiving this medicine? Side effects that you should report to your doctor or health care professional as soon as possible: -allergic reactions like skin rash, itching or hives, swelling of the face, lips, or tongue -breathing problems -changes in vision -chest pain or tightness -eye irritation, pain -fast, irregular heartbeat -infection -numbness -speech problems -swallowing problems -unusual weakness Side effects that usually do not require medical attention (report to your doctor or health care professional if they continue or are bothersome): -bruising or pain at site where injected -drooping eyelid -dry eyes or mouth -headache -muscles aches, pains -sensitivity to light -tearing This list may not describe all possible side effects. Call your doctor for medical advice about side effects. You may report side effects to FDA at 1-800-FDA-1088. Where should I keep my medicine? This drug is given in a hospital or clinic and will not be stored at home. NOTE: This sheet is a summary. It may not cover all possible information. If you have questions about this medicine, talk to your doctor, pharmacist, or health care provider.  2019 [employer]vier/Gold Standard (2018-05-03 14:21:42)

## 2019-02-14 NOTE — Progress Notes (Signed)
PATIENT: [name] DOB: 07-07-1986  REASON FOR VISIT: follow up HISTORY FROM: patient  Virtual Visit via Telephone Note  I connected with Daiva Eves on 02/14/19 at 10:30 AM EDT by telephone and verified that I am speaking with the correct person using two identifiers.   I discussed the limitations, risks, security and privacy concerns of performing an evaluation and management service by telephone and the availability of in person appointments. I also discussed with the patient that there may be a patient responsible charge related to this service. The patient expressed understanding and agreed to proceed.   History of Present Illness:  02/14/19 [name] is a 33 y.o. female for follow up of migraines. She continues Amovig monthly and Zomig for abortive therapy.  She reports that she continues to have 4-5 migraine days per month.  She has been thinking about adding another preventative therapy to reduce these migraines.  She feels that they most often occur while she is at work.  She has tried multiple medications in the past including topiramate IR, propranolol, and nortriptyline.  She was advised to start Qudexy at a previous visit but opted not to as Aimovig did seem to work.  She does feel that Zomig helps for abortive therapy.   History (copied from Dr [name] note on 02/15/2018)  Interval history: She is on Aimovig and doing well.  Since starting Aimovig, severity and frequency decreased, no severe headaches. And less headaches. Discussed options, daily meds, increase Aimovig. Continue acute management.   January: 5 migraine days and 4 headache days February: 4 migraine days March: 3 migraine days   Tried: Topiramate IR and nortriptyline, propranolol, relpax, cambia, zofran, Aimovig, zomig, toradol injections  Interval history 08/17/2017:  She uses zomig, works faster nasally, [place] afterwards helps if more severe. In one hour the migraine has resolved. Zomig and  cambia work best. She is getting both zomig and cambia. Severity is better with Aimovig. No side effects. She had some numbness and tingling several times. Zofran helps with nausea. Also helps with nausea on non-headache days. She has not started the Qudexy. Will wait and see how Aimovig works. Will hold for now and wait and see. The migraines are not as intense, increased severity and frequency.   June 2 migraine days of headaches July 7 migraine days August 3 migraine days Sep 5 migraine days   Interval history 04/13/2017: Patient is here for follow-up of migraines. She has failed multiple medications as below. She is using relpax and zofran for acute management and may also use a cambia if needed. She did not tolerate propranolol. She has side effects to medications. With Nortriptyline she did not sleep well and was anxious. The Toradol shots work well. But no preventative has worked. Will try Qudexy and also Erenumab. She has 15 headache days a month and superimposed migraines as below. Discussed teratogenicity of topiramate do not get pregnant. We'll see her back in 4 months. She is to email me so we can slowly increase the topiramate extended release. Stop for any side effects or email me.  January: 5 migraines necessitating Relpax. February 2 migraines, March had 5 migraines and 6 headaches needing treatment of other kind, May 9 migraines necessitating relpax and a few others where she took tylenol or excedin. No medication overuse.  Tried: Topiramate IRand nortriptyline(do not tolerate), propranolol(did not tolerate), relpax(works), zofran, cambia, Zomig nasal spray (worked well)  Interval history 11/19/2016: She has had 2 headaches with vision changes in the  right eye and was seen at the ED for severe headache. Relpax helps along with cambia. She has 10 headache days a month and the following number of migraines(below). Mother is here who also has similar headaches. We discussed  medications, she is toelrating propranolol well withh slowly increase. Discussed triptan's, Relpax appears to work but may wear off advised her to try Relpax with Cambia and zofran and also discussed Zomig (provided samples of nasal spray) and other triptan's and different ways to administer including nosesprays and powders and injectables. Also discussed Cephaly device and other devices available on the market. Migraines: September 2  October 4 Nov 3 Dec 4    Tried: Topiramate and nortriptyline, now on propranolol  Interval history 07/09/2016:Imitrex not entirely working and Makes neck stiff, will try Relpax next. Weekends are worse. In June had 4 headaches days ranging from 4-6/10 in pain. In July she had 4 mild headaches 2-4/10, moderate 2 6-8/10 and had one very severe. They slowly progress but can get bad. She has had 4 in August one 7/10. She feels dehydration makes them worse. She has difficulty sleeping throughout the night. She has tried imitrex and ibuprofen and tylenol. She has a lot nausea and pills make her sick and difficulty swallowing. Discussed starting a daily preventative and also options for acute management. We'll start nortriptyline at night for prevention. We'll provide Relpax, Zofran and can be for acute management. Patient is to see me back in 4 months.   WNI:OEVOJJ [name] a 33 y.o.femalehere as a referral from Dr. Rhona Leavens headaches. Past medical history of asthma, Headaches started for most of her life since high school. Worsening over the last 5-6 years. They are more intense, has to lay down. Usually around the eyes, mostly around the temples. Usually more unilateral then switches to the other side. Throbbing behind the eyes. Light sensitivity, shuts the blinds, lays down with an ice pack. +nausea but no vomiting. She has them on the weekends. 10 headache days a month. 2-3 are migraines. Migraines last the whole day. Gradually gets stronger over a few  hours, can get to a 7/10 in pain at its worst on average 3-5/10. Last all day, up to 24 hours. No aura. No dizziness or vertigo or weakness or sensory changes. Mother, aunt, grandmother with migraines. No plans to get pregnant. No medication overuse. Takes Excedrin occ. Or Tylenol but no more than a few times a week. The non-migrainous headaches last just a few hours and they are dull and not too painful. No blurry vision. No aura. No family history of headaches.  Reviewed notes, labs and imaging from outside physicians, which showed: Reviewed notes from OB/GYN. She was started on birth control pills for management of abnormal bleeding, her regulars were regular but she was having midcycle spotting. No significant dysmenorrhea. Reported she had problems on and up with headaches for some time that it never been evaluated. Not sexually active. No urinary or bowel complaints. Exam was negative, thyroid nonpalpable, breasts normal, lungs clear, regular rate and rhythm without murmurs or gallop, no carotid or abdominal bruits, abdominal exam benign, no masses or organomegaly or tenderness, normal external genitalia, cervix unremarkable, uterus and adnexa normal.   Observations/Objective:  Generalized: Well developed, in no acute distress  Mentation: Alert oriented to time, place, history taking. Follows all commands speech and language fluent   Assessment and Plan:  33 y.o. 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 here with    ICD-10-CM   1. Migraine without aura and without status migrainosus, not intractable G43.009    Left we have discussed multiple options for tighter control of migraines. Several options discussed included trying Oudexy, Effexor or Botox. mechanism of action as well as side effects discussed with each medication. She would like to research these options before  deciding how to proceed. She will continue Amovig as prescribed as well as Zomig for abortive therapy. I have educated her on complimentary therapies to help reduce migraines. She will reach back out to me when she has had time to research options. She was instructed to schedule a follow up with [place] in 3 months. She verbalizes understanding and agreement with plan.   No orders of the defined types were placed in this encounter.   No orders of the defined types were placed in this encounter.    Follow Up Instructions:  I discussed the assessment and treatment plan with the patient. The patient was provided an opportunity to ask questions and all were answered. The patient agreed with the plan and demonstrated an understanding of the instructions.   The patient was advised to call back or seek an in-person evaluation if the symptoms worsen or if the condition fails to improve as anticipated.  I provided 30 minutes of non-face-to-face time during this encounter. Patient is located at her place of residence; provider at her place of residence.    Debbora Presto, NP

## 2019-02-14 NOTE — Progress Notes (Signed)
Made any corrections needed, and agree with history, physical, neuro exam,assessment and plan as stated.     Dominika Losey, MD [facility]  

## 2019-03-11 DIAGNOSIS — Z0000 Encounter for general adult medical examination without abnormal findings: Secondary | ICD-10-CM | POA: Diagnosis not present

## 2019-03-22 ENCOUNTER — Telehealth: Payer: Self-pay | Admitting: Family Medicine

## 2019-03-22 NOTE — Telephone Encounter (Signed)
Left message on pt's VM to schedule a 3 month f/u with Amy (around 05/16/2019).

## 2019-04-11 DIAGNOSIS — H5213 Myopia, bilateral: Secondary | ICD-10-CM | POA: Diagnosis not present

## 2019-04-21 DIAGNOSIS — Z01419 Encounter for gynecological examination (general) (routine) without abnormal findings: Secondary | ICD-10-CM | POA: Diagnosis not present

## 2019-04-21 DIAGNOSIS — Z6824 Body mass index (BMI) 24.0-24.9, adult: Secondary | ICD-10-CM | POA: Diagnosis not present

## 2019-04-27 ENCOUNTER — Telehealth: Payer: Self-pay

## 2019-04-27 NOTE — Telephone Encounter (Signed)
Pending renewal for Aimovig 140 mg/mL Key: [id number] Rx #: [id number] ICD 10 code: C94.709  I will update once a decision has been made.

## 2019-05-02 NOTE — Telephone Encounter (Signed)
Aimovig was approved. Approved from 04/27/2019 through 04/25/2020. A copy of the approval letter has been faxed to the patient's pharmacy. Confirmation fax has been received.          [id number] [address], [place]. [phone] (Phone) [phone] (Fax)

## 2019-05-16 ENCOUNTER — Encounter: Payer: Self-pay | Admitting: Family Medicine

## 2019-05-16 ENCOUNTER — Other Ambulatory Visit: Payer: Self-pay

## 2019-05-16 ENCOUNTER — Ambulatory Visit: Payer: BLUE CROSS/BLUE SHIELD | Admitting: Family Medicine

## 2019-05-16 VITALS — BP 123/75 | HR 96 | Temp 98.6°F | Ht 62.0 in | Wt 134.2 lb

## 2019-05-16 DIAGNOSIS — G43009 Migraine without aura, not intractable, without status migrainosus: Secondary | ICD-10-CM

## 2019-05-16 MED ORDER — CAMBIA 50 MG PO PACK: PACK | ORAL | 5 refills | Status: DC

## 2019-05-16 MED ORDER — ZOMIG 5 MG NA SOLN: NASAL | 5 refills | Status: DC

## 2019-05-16 MED ORDER — AIMOVIG 140 MG/ML ~~LOC~~ SOAJ: 140.0000 mg | SUBCUTANEOUS | 11 refills | Status: DC

## 2019-05-16 NOTE — Progress Notes (Signed)
PATIENT: [name] DOB: [birth year]  REASON FOR VISIT: follow up HISTORY FROM: patient  Chief Complaint  Patient presents with   Follow-up    3 mon f/u. Alone New room. No new concerns at this time.      HISTORY OF PRESENT ILLNESS: Today 05/16/19 [name] is a 33 y.o. female here today for follow up of migraines. She feels that she is doing well. Amovig is working without adverse effects. She reports about 4 migraines in April,4 in May, and 7 in June. Headaches are mild. Zomig helps. She has not taken Cambia in a while due to being out of medication. She is interested in finding triggers. She has not documented a migraine journal. She does drink caffeine regularly.    HISTORY: (copied from my note on 02/14/2019)  [name] is a 33 y.o. female for follow up of migraines. She continues Amovig monthly and Zomig for abortive therapy.  She reports that she continues to have 4-5 migraine days per month.  She has been thinking about adding another preventative therapy to reduce these migraines.  She feels that they most often occur while she is at work.  She has tried multiple medications in the past including topiramate IR, propranolol, and nortriptyline.  She was advised to start Qudexy at a previous visit but opted not to as Aimovig did seem to work.  She does feel that Zomig helps for abortive therapy.   History (copied from Dr [name] note on 02/15/2018)  Interval history: She is on Aimovig and doing well. Since starting Aimovig, severity and frequency decreased, no severe headaches. And less headaches.Discussed options, daily meds, increase Aimovig. Continue acute management.   January: 5 migraine days and 4 headache days February: 4 migraine days March: 3 migraine days  Tried: TopiramateIRand nortriptyline, propranolol, relpax, cambia, zofran, Aimovig, zomig, toradol injections  Interval history 08/17/2017: She uses zomig, works faster nasally, [place]  afterwards helps if more severe. In one hour the migraine has resolved. Zomig and cambia work best. She is getting both zomig and cambia. Severity is better with Aimovig. No side effects. She had some numbness and tingling several times. Zofran helps with nausea. Also helps with nausea on non-headache days. She has not started the Qudexy. Will wait and see how Aimovig works. Will hold for now and wait and see. The migraines are not as intense, increased severity and frequency.   June 2 migraine days of headaches July 7 migraine days August 3 migraine days Sep 5 migraine days   Interval history 04/13/2017: Patient is here for follow-up of migraines. She has failed multiple medications as below. She is using relpax and zofran for acute management and may also use a cambia if needed. She did not tolerate propranolol. She has side effects to medications. With Nortriptyline she did not sleep well and was anxious. The Toradol shots work well. But no preventative has worked. Will try Qudexy and also Erenumab. She has 15 headache days a month and superimposed migraines as below. Discussed teratogenicity of topiramate do not get pregnant. We'll see her back in 4 months. She is to email me so we can slowly increase the topiramate extended release. Stop for any side effects or email me.  January: 5 migraines necessitating Relpax. February 2 migraines, March had 5 migraines and 6 headaches needing treatment of other kind, May 9 migraines necessitating relpax and a few others where she took tylenol or excedin. No medication overuse.  Tried: Topiramate IRand nortriptyline(do not tolerate),  propranolol(did not tolerate), relpax(works), zofran, cambia, Zomig nasal spray (worked well)  Interval history 11/19/2016: She has had 2 headaches with vision changes in the right eye and was seen at the ED for severe headache. Relpax helps along with cambia. She has 10 headache days a month and the following number of  migraines(below). Mother is here who also has similar headaches. We discussed medications, she is toelrating propranolol well withh slowly increase. Discussed triptan's, Relpax appears to work but may wear off advised her to try Relpax with Cambia and zofran and also discussed Zomig (provided samples of nasal spray) and other triptan's and different ways to administer including nosesprays and powders and injectables. Also discussed Cephaly device and other devices available on the market. Migraines: September 2  October 4 Nov 3 Dec 4  Tried: Topiramate and nortriptyline, now on propranolol  Interval history 07/09/2016:Imitrex not entirely working and Makes neck stiff, will try Relpax next. Weekends are worse. In June had 4 headaches days ranging from 4-6/10 in pain. In July she had 4 mild headaches 2-4/10, moderate 2 6-8/10 and had one very severe. They slowly progress but can get bad. She has had 4 in August one 7/10. She feels dehydration makes them worse. She has difficulty sleeping throughout the night. She has tried imitrex and ibuprofen and tylenol. She has a lot nausea and pills make her sick and difficulty swallowing. Discussed starting a daily preventative and also options for acute management. We'll start nortriptyline at night for prevention. We'll provide Relpax, Zofran and can be for acute management. Patient is to see me back in 4 months.   ZDG:UYQIHK [name] a 33 y.o.femalehere as a referral from Dr. Rhona Leavens headaches. Past medical history of asthma, Headaches started for most of her life since high school. Worsening over the last 5-6 years. They are more intense, has to lay down. Usually around the eyes, mostly around the temples. Usually more unilateral then switches to the other side. Throbbing behind the eyes. Light sensitivity, shuts the blinds, lays down with an ice pack. +nausea but no vomiting. She has them on the weekends. 10 headache days a month. 2-3 are  migraines. Migraines last the whole day. Gradually gets stronger over a few hours, can get to a 7/10 in pain at its worst on average 3-5/10. Last all day, up to 24 hours. No aura. No dizziness or vertigo or weakness or sensory changes. Mother, aunt, grandmother with migraines. No plans to get pregnant. No medication overuse. Takes Excedrin occ. Or Tylenol but no more than a few times a week. The non-migrainous headaches last just a few hours and they are dull and not too painful. No blurry vision. No aura. No family history of headaches.  Reviewed notes, labs and imaging from outside physicians, which showed: Reviewed notes from OB/GYN. She was started on birth control pills for management of abnormal bleeding, her regulars were regular but she was having midcycle spotting. No significant dysmenorrhea. Reported she had problems on and up with headaches for some time that it never been evaluated. Not sexually active. No urinary or bowel complaints. Exam was negative, thyroid nonpalpable, breasts normal, lungs clear, regular rate and rhythm without murmurs or gallop, no carotid or abdominal bruits, abdominal exam benign, no masses or organomegaly or tenderness, normal external genitalia, cervix unremarkable, uterus and adnexa normal.    REVIEW OF SYSTEMS: Out of a complete 14 system review of symptoms, the patient complains only of the following symptoms, dizziness and headaches, and all other  reviewed systems are negative.  ALLERGIES: Allergies  Allergen Reactions   Meloxicam Other (See Comments)    Stomach cramps   Sulfa Antibiotics Hives    HOME MEDICATIONS: Outpatient Medications Prior to Visit  Medication Sig Dispense Refill   acetaminophen (TYLENOL) 500 MG tablet Take 500 mg by mouth every 6 (six) hours as needed for headache.     doxycycline (VIBRAMYCIN) 50 MG capsule Take 50 mg by mouth every other day.     ketorolac (TORADOL) 30 MG/ML injection Inject 30 mg into the vein once.       ondansetron (ZOFRAN ODT) 4 MG disintegrating tablet Take 1-2 tablet (4-8 mg total) by mouth every 8 (eight) hours as needed for nausea or vomiting. 20 tablet 12   PROAIR HFA 108 (90 Base) MCG/ACT inhaler Take 2 puffs by mouth as needed for wheezing or shortness of breath.   2   CAMBIA 50 MG PACK MIX ONE PACKET WITH 1-2 OUNCES OF WATER. DRINK IMMEDIATELY AS A SINGLE DOSE.  9 each 5   Erenumab-aooe (AIMOVIG) 140 MG/ML SOAJ Inject 140 mg into the skin every 30 (thirty) days. 1 pen 11   ZOMIG 5 MG nasal solution SPRAY 1 SPRAY INTO THE NOSE AS NEEDED FOR MIGRAINE. MAY REPEAT IN 2 HOURS. NO MORE THAN TWICE A DAY 12 Units 5   No facility-administered medications prior to visit.     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   GERD (gastroesophageal reflux disease)    Vitreous detachment of right eye     PAST SURGICAL HISTORY: Past Surgical History:  Procedure Laterality Date   BREAST BIOPSY Left 04/2017   WISDOM TOOTH EXTRACTION  2004   x4    FAMILY HISTORY: Family History  Problem Relation Age of Onset   High blood pressure Mother    High blood pressure Father    Diabetes Mellitus II Maternal Grandmother    Diabetes Mellitus I Maternal Grandfather    Lung cancer Maternal Grandfather    Stroke Paternal Grandfather    Stroke Paternal Grandmother    Breast cancer Paternal Aunt     SOCIAL HISTORY: Social History   Socioeconomic History   Marital status: Single    Spouse name: Not on file   Number of children: 0   Years of education: 16   Highest education level: Not on file  Occupational History   Occupation: Technical brewer [employer]  Social Needs   Financial resource strain: Not on file   Food insecurity    Worry: Not on file    Inability: Not on file   Transportation needs    Medical: Not on file    Non-medical: Not on file  Tobacco Use   Smoking status: Never Smoker   Smokeless tobacco:  Never Used  Substance and Sexual Activity   Alcohol use: No    Alcohol/week: 0.0 standard drinks   Drug use: No   Sexual activity: Not on file    Comment: Stopped BC pills around March 2018  Lifestyle   Physical activity    Days per week: Not on file    Minutes per session: Not on file   Stress: Not on file  Relationships   Social connections    Talks on phone: Not on file    Gets together: Not on file    Attends religious service: Not on file    Active member of club or organization: Not on file  Attends meetings of clubs or organizations: Not on file    Relationship status: Not on file   Intimate partner violence    Fear of current or ex partner: Not on file    Emotionally abused: Not on file    Physically abused: Not on file    Forced sexual activity: Not on file  Other Topics Concern   Not on file  Social History Narrative   Lives with mother and stepfather   Caffeine use: 1 coffee/day   1 soda per day      PHYSICAL EXAM  Vitals:   05/16/19 0850  BP: 123/75  Pulse: 96  Temp: 98.6 F (37 C)  TempSrc: Oral  Weight: 134 lb 3.2 oz (60.9 kg)  Height: 5\' 2"  (1.575 m)   Body mass index is 24.55 kg/m.  Generalized: Well developed, in no acute distress  Cardiology: normal rate and rhythm, no murmur noted Neurological examination  Mentation: Alert oriented to time, place, history taking. Follows all commands speech and language fluent Cranial nerve II-XII: Pupils were equal round reactive to light. Extraocular movements were full, visual field were full on confrontational test. Facial sensation and strength were normal. Uvula tongue midline. Head turning and shoulder shrug  were normal and symmetric. Motor: The motor testing reveals 5 over 5 strength of all 4 extremities. Good symmetric motor tone is noted throughout.   Coordination: Cerebellar testing reveals good finger-nose-finger and heel-to-shin bilaterally.  Gait and station: Gait is normal. Tandem  gait is normal. Romberg is negative. No drift is seen.    DIAGNOSTIC DATA (LABS, IMAGING, TESTING) - I reviewed patient records, labs, notes, testing and imaging myself where available.  No flowsheet data found.   Lab Results  Component Value Date   WBC 4.6 12/09/2016   HGB 13.1 12/09/2016   HCT 39.9 12/09/2016   MCV 84.2 12/09/2016   PLT 225 12/09/2016      Component Value Date/Time   NA 141 12/09/2016 1554   NA 140 03/25/2016 1124   K 4.1 12/09/2016 1554   CL 106 12/09/2016 1554   CO2 28 12/09/2016 1554   GLUCOSE 96 12/09/2016 1554   BUN 8 12/09/2016 1554   BUN 8 03/25/2016 1124   CREATININE 0.73 12/09/2016 1554   CALCIUM 9.7 12/09/2016 1554   PROT 7.2 03/25/2016 1124   ALBUMIN 4.2 03/25/2016 1124   AST 14 03/25/2016 1124   ALT 11 03/25/2016 1124   ALKPHOS 52 03/25/2016 1124   BILITOT 0.6 03/25/2016 1124   GFRNONAA >60 12/09/2016 1554   GFRAA >60 12/09/2016 1554   No results found for: CHOL, HDL, LDLCALC, LDLDIRECT, TRIG, CHOLHDL No results found for: HGBA1C No results found for: VITAMINB12 No results found for: TSH     ASSESSMENT AND PLAN 33 y.o. year old female  has a past medical history of Fibroadenoma of breast (04/2017), GERD (gastroesophageal reflux disease), and Vitreous detachment of right eye. here with     ICD-10-CM   1. Migraine without aura and without status migrainosus, not intractable  G43.009 Diclofenac Potassium (CAMBIA) 50 MG PACK    zolmitriptan (ZOMIG) 5 MG nasal solution    Erenumab-aooe (AIMOVIG) 140 MG/ML SOAJ    Overall she is doing very well on Aimovig.  Migraines are much less severe.  We will continue Aimovig as prescribed.  I will also refill Cambia and Zomig for abortive therapy.  She was advanced regular use of these medications.  I have provided educational materials on migraine management, common triggers,  and ways to document.  She was advised to follow-up annually.  She is aware that she can call sooner if needed for worsening  migraines.  She verbalizes understanding and agreement with this plan.   No orders of the defined types were placed in this encounter.    Meds ordered this encounter  Medications   Diclofenac Potassium (CAMBIA) 50 MG PACK    Sig: MIX ONE PACKET WITH 1-2 OUNCES OF WATER. DRINK IMMEDIATELY AS A SINGLE DOSE.    Dispense:  9 each    Refill:  5    Order Specific Question:   Supervising Provider    Answer:   Melvenia Beam [[id number]]   zolmitriptan (ZOMIG) 5 MG nasal solution    Sig: SPRAY 1 SPRAY INTO THE NOSE AS NEEDED FOR MIGRAINE. MAY REPEAT IN 2 HOURS. NO MORE THAN TWICE A DAY    Dispense:  12 Units    Refill:  5    Order Specific Question:   Supervising Provider    Answer:   Melvenia Beam [[id number]]   Erenumab-aooe (AIMOVIG) 140 MG/ML SOAJ    Sig: Inject 140 mg into the skin every 30 (thirty) days.    Dispense:  1 pen    Refill:  11    Pt has copay card even if insurance denies    Order Specific Question:   Supervising Provider    Answer:   Melvenia Beam [id number]      I spent 15 minutes with the patient. 50% of this time was spent counseling and educating patient on plan of care and medications.    Debbora Presto, FNP-C 05/16/2019, 9:19 AM [facility] [address] [place], [place] [address] [phone]

## 2019-05-16 NOTE — Patient Instructions (Signed)
Continue current therapy  Follow up annually  Migraine Headache A migraine headache is a very strong throbbing pain on one side or both sides of your head. This type of headache can also cause other symptoms. It can last from 4 hours to 3 days. Talk with your doctor about what things may bring on (trigger) this condition. What are the causes? The exact cause of this condition is not known. This condition may be triggered or caused by:  Drinking alcohol.  Smoking.  Taking medicines, such as: ? Medicine used to treat chest pain (nitroglycerin). ? Birth control pills. ? Estrogen. ? Some blood pressure medicines.  Eating or drinking certain products.  Doing physical activity. Other things that may trigger a migraine headache include:  Having a menstrual period.  Pregnancy.  Hunger.  Stress.  Not getting enough sleep or getting too much sleep.  Weather changes.  Tiredness (fatigue). What increases the risk?  Being 73-66 years old.  Being female.  Having a family history of migraine headaches.  Being Caucasian.  Having depression or anxiety.  Being very overweight. What are the signs or symptoms?  A throbbing pain. This pain may: ? Happen in any area of the head, such as on one side or both sides. ? Make it hard to do daily activities. ? Get worse with physical activity. ? Get worse around bright lights or loud noises.  Other symptoms may include: ? Feeling sick to your stomach (nauseous). ? Vomiting. ? Dizziness. ? Being sensitive to bright lights, loud noises, or smells.  Before you get a migraine headache, you may get warning signs (an aura). An aura may include: ? Seeing flashing lights or having blind spots. ? Seeing bright spots, halos, or zigzag lines. ? Having tunnel vision or blurred vision. ? Having numbness or a tingling feeling. ? Having trouble talking. ? Having weak muscles.  Some people have symptoms after a migraine headache  (postdromal phase), such as: ? Tiredness. ? Trouble thinking (concentrating). How is this treated?  Taking medicines that: ? Relieve pain. ? Relieve the feeling of being sick to your stomach. ? Prevent migraine headaches.  Treatment may also include: ? Having acupuncture. ? Avoiding foods that bring on migraine headaches. ? Learning ways to control your body functions (biofeedback). ? Therapy to help you know and deal with negative thoughts (cognitive behavioral therapy). Follow these instructions at home: Medicines  Take over-the-counter and prescription medicines only as told by your doctor.  Ask your doctor if the medicine prescribed to you: ? Requires you to avoid driving or using heavy machinery. ? Can cause trouble pooping (constipation). You may need to take these steps to prevent or treat trouble pooping:  Drink enough fluid to keep your pee (urine) pale yellow.  Take over-the-counter or prescription medicines.  Eat foods that are high in fiber. These include beans, whole grains, and fresh fruits and vegetables.  Limit foods that are high in fat and sugar. These include fried or sweet foods. Lifestyle  Do not drink alcohol.  Do not use any products that contain nicotine or tobacco, such as cigarettes, e-cigarettes, and chewing tobacco. If you need help quitting, ask your doctor.  Get at least 8 hours of sleep every night.  Limit and deal with stress. General instructions      Keep a journal to find out what may bring on your migraine headaches. For example, write down: ? What you eat and drink. ? How much sleep you get. ? Any change  in what you eat or drink. ? Any change in your medicines.  If you have a migraine headache: ? Avoid things that make your symptoms worse, such as bright lights. ? It may help to lie down in a dark, quiet room. ? Do not drive or use heavy machinery. ? Ask your doctor what activities are safe for you.  Keep all follow-up  visits as told by your doctor. This is important. Contact a doctor if:  You get a migraine headache that is different or worse than others you have had.  You have more than 15 headache days in one month. Get help right away if:  Your migraine headache gets very bad.  Your migraine headache lasts longer than 72 hours.  You have a fever.  You have a stiff neck.  You have trouble seeing.  Your muscles feel weak or like you cannot control them.  You start to lose your balance a lot.  You start to have trouble walking.  You pass out (faint).  You have a seizure. Summary  A migraine headache is a very strong throbbing pain on one side or both sides of your head. These headaches can also cause other symptoms.  This condition may be treated with medicines and changes to your lifestyle.  Keep a journal to find out what may bring on your migraine headaches.  Contact a doctor if you get a migraine headache that is different or worse than others you have had.  Contact your doctor if you have more than 15 headache days in a month. This information is not intended to replace advice given to you by your health care provider. Make sure you discuss any questions you have with your health care provider. Document Released: 08/05/2008 Document Revised: 02/18/2019 Document Reviewed: 12/09/2018 Elsevier Patient Education  2020 [employer].

## 2019-05-17 NOTE — Progress Notes (Signed)
Made any corrections needed, and agree with history, physical, neuro exam,assessment and plan as stated.     Antonia Ahern, MD [facility]  

## 2019-05-23 ENCOUNTER — Telehealth: Payer: Self-pay | Admitting: *Deleted

## 2019-05-23 DIAGNOSIS — G43009 Migraine without aura, not intractable, without status migrainosus: Secondary | ICD-10-CM

## 2019-05-23 MED ORDER — CAMBIA 50 MG PO PACK: PACK | ORAL | 5 refills | Status: DC

## 2019-05-23 NOTE — Telephone Encounter (Signed)
[name], let patient know even though she is not approved she can get cambia for $20 a month through their special program. thanks

## 2019-05-23 NOTE — Telephone Encounter (Signed)
Received request for PA on cambia.  Initiated to [facility].  Pt has tried imitrex, relpax, zofran, excedrin, tylenol, notritptyline, propranolol, zomig, topiramate, aimovig, toradol, naproxen, meloxicam, motrin.  Diagnosis, G43.009.  Case denied.  She has not tried diclofenac tablets or capsules.  ID [id number].  [phone].

## 2019-05-23 NOTE — Telephone Encounter (Signed)
I spoke with the patient. She understands Cathren Harsh was denied by insurance. She is familiar with [facility] and would like the medication sent there to take advantage of the savings program.  Cambia sent to [facility].

## 2019-05-26 NOTE — Telephone Encounter (Signed)
Received fax from [facility] that PA denial needed prior to getting product with using savings card,  Fax confirmation received to [phone],  ofv spoke to [facility].  [phone].

## 2019-05-26 NOTE — Telephone Encounter (Signed)
Per [employer] of [place] denied d/t not meeting requirement of trial of diclofenac tablets or capsules. Prescription was already sent to [facility] where they have a savings program in place.

## 2019-09-11 DIAGNOSIS — J3489 Other specified disorders of nose and nasal sinuses: Secondary | ICD-10-CM | POA: Diagnosis not present

## 2019-09-11 DIAGNOSIS — J029 Acute pharyngitis, unspecified: Secondary | ICD-10-CM | POA: Diagnosis not present

## 2019-10-10 DIAGNOSIS — Z0000 Encounter for general adult medical examination without abnormal findings: Secondary | ICD-10-CM | POA: Diagnosis not present

## 2019-10-10 DIAGNOSIS — Z23 Encounter for immunization: Secondary | ICD-10-CM | POA: Diagnosis not present

## 2019-10-10 DIAGNOSIS — E7800 Pure hypercholesterolemia, unspecified: Secondary | ICD-10-CM | POA: Diagnosis not present

## 2019-10-31 ENCOUNTER — Encounter: Payer: Self-pay | Admitting: Family Medicine

## 2019-11-07 ENCOUNTER — Telehealth: Payer: Self-pay

## 2019-11-07 NOTE — Telephone Encounter (Signed)
Pa for [name] has been sent a received instant approval.  PA effective from 11/07/2019-11/05/2020. Pt notified. Via my chart.

## 2019-11-30 ENCOUNTER — Encounter: Payer: Self-pay | Admitting: Family Medicine

## 2019-12-06 ENCOUNTER — Encounter: Payer: Self-pay | Admitting: Family Medicine

## 2019-12-06 DIAGNOSIS — G43B0 Ophthalmoplegic migraine, not intractable: Secondary | ICD-10-CM | POA: Diagnosis not present

## 2019-12-06 DIAGNOSIS — H43813 Vitreous degeneration, bilateral: Secondary | ICD-10-CM | POA: Diagnosis not present

## 2019-12-06 DIAGNOSIS — H04123 Dry eye syndrome of bilateral lacrimal glands: Secondary | ICD-10-CM | POA: Diagnosis not present

## 2019-12-08 ENCOUNTER — Telehealth: Payer: Self-pay | Admitting: *Deleted

## 2019-12-08 ENCOUNTER — Encounter: Payer: Self-pay | Admitting: *Deleted

## 2019-12-08 NOTE — Telephone Encounter (Addendum)
Received my chart from [facility] stating [facility]. [facility], spoke with [facility]ist and advised insuance approved it last year through June this year. New Insurance ID# [id number], group [id number], [place] [id number]. She stated it is now saying new PA needed.  Started PA on CMM, key: [id number], Dx G43.009 Your information has been submitted to [facility]. [employer] [place] will review the request and fax you a determination directly, typically within 3 business days of your submission once all necessary information is received.  If [employer] [facility] has not responded in 3 business days or if you have any questions about your submission, contact [facility] at [phone]

## 2019-12-08 NOTE — Telephone Encounter (Addendum)
Received message from [facility]: plan cancelled the response. In order to obtain a reasoning and to see if this PA is still needed, call [employer] of [place] directly at [phone]. [employer], spoke with Chenelle who stated this PA was originally sent and denied on 11/25/19. It went to provider review today and decision is pending, will be faxed in 7 days.  Sent patient my chart to advise.

## 2019-12-15 ENCOUNTER — Encounter: Payer: Self-pay | Admitting: *Deleted

## 2019-12-15 NOTE — Telephone Encounter (Signed)
Received fax from [place], [place] approved, 12/08/19 - 12/06/20. I will send patient my chart and advise her.

## 2020-01-11 ENCOUNTER — Telehealth: Payer: Self-pay | Admitting: Family Medicine

## 2020-01-11 DIAGNOSIS — G43009 Migraine without aura, not intractable, without status migrainosus: Secondary | ICD-10-CM

## 2020-01-11 MED ORDER — CAMBIA 50 MG PO PACK: PACK | ORAL | 5 refills | Status: DC

## 2020-01-11 NOTE — Telephone Encounter (Signed)
I resent prescription for pt to [facility] fro cambia.  Last 01-07-19 was PA effective until 11-05-20.

## 2020-01-11 NOTE — Telephone Encounter (Signed)
1) Medication(s) Requested (by name): Diclofenac Potassium (CAMBIA) 50 MG PACK   2) Pharmacy of Choice: [facility], [place] [address]  [place], [place] [address]   3) Special Requests:  Patient was advised by pharmacy a PA is needed in order to fill medication

## 2020-01-12 NOTE — Telephone Encounter (Signed)
I [facility] and they shipped out to pt yesterday 01-11-20. ($10 copay). Nothing needed on our end.

## 2020-01-13 ENCOUNTER — Telehealth: Payer: Self-pay | Admitting: Family Medicine

## 2020-01-13 DIAGNOSIS — G43009 Migraine without aura, not intractable, without status migrainosus: Secondary | ICD-10-CM

## 2020-01-13 NOTE — Telephone Encounter (Signed)
Pt called stating that she is needing a PA for her zolmitriptan (ZOMIG) 5 MG nasal solution and [facility]  pt states she is in the middle of changing to this pharmacy.

## 2020-01-16 MED ORDER — ZOLMITRIPTAN 5 MG NA SOLN: NASAL | 5 refills | Status: DC

## 2020-01-16 NOTE — Telephone Encounter (Signed)
PA not needed this medication has been approved through 02-08-2022.

## 2020-01-18 ENCOUNTER — Encounter: Payer: Self-pay | Admitting: Family Medicine

## 2020-01-23 NOTE — Telephone Encounter (Signed)
Received zolmitriptan 5 mg solution approval letter. I faxed it to the pt's pharmacy. Received a receipt of confirmation.

## 2020-03-12 ENCOUNTER — Encounter: Payer: Self-pay | Admitting: Family Medicine

## 2020-03-13 ENCOUNTER — Other Ambulatory Visit: Payer: Self-pay

## 2020-03-13 DIAGNOSIS — G43009 Migraine without aura, not intractable, without status migrainosus: Secondary | ICD-10-CM

## 2020-03-13 MED ORDER — CAMBIA 50 MG PO PACK: PACK | ORAL | 5 refills | Status: DC

## 2020-04-09 ENCOUNTER — Encounter: Payer: Self-pay | Admitting: Family Medicine

## 2020-04-12 DIAGNOSIS — Z13220 Encounter for screening for lipoid disorders: Secondary | ICD-10-CM | POA: Diagnosis not present

## 2020-04-12 DIAGNOSIS — Z0000 Encounter for general adult medical examination without abnormal findings: Secondary | ICD-10-CM | POA: Diagnosis not present

## 2020-04-18 DIAGNOSIS — H04123 Dry eye syndrome of bilateral lacrimal glands: Secondary | ICD-10-CM | POA: Diagnosis not present

## 2020-04-18 DIAGNOSIS — H52223 Regular astigmatism, bilateral: Secondary | ICD-10-CM | POA: Diagnosis not present

## 2020-04-18 DIAGNOSIS — H43813 Vitreous degeneration, bilateral: Secondary | ICD-10-CM | POA: Diagnosis not present

## 2020-04-18 DIAGNOSIS — H5213 Myopia, bilateral: Secondary | ICD-10-CM | POA: Diagnosis not present

## 2020-04-18 DIAGNOSIS — G43B0 Ophthalmoplegic migraine, not intractable: Secondary | ICD-10-CM | POA: Diagnosis not present

## 2020-04-18 DIAGNOSIS — H40013 Open angle with borderline findings, low risk, bilateral: Secondary | ICD-10-CM | POA: Diagnosis not present

## 2020-04-24 ENCOUNTER — Telehealth: Payer: Self-pay

## 2020-04-24 NOTE — Telephone Encounter (Signed)
Received  a PA request from [facility],  A PA has already been sent and coverage is until 12/06/20 Spoke to pt she is not having issues picking up medication

## 2020-05-16 ENCOUNTER — Encounter: Payer: Self-pay | Admitting: Family Medicine

## 2020-05-16 ENCOUNTER — Ambulatory Visit: Payer: BC Managed Care – PPO | Admitting: Family Medicine

## 2020-05-16 VITALS — BP 123/68 | HR 78 | Ht 62.0 in | Wt 138.0 lb

## 2020-05-16 DIAGNOSIS — G43009 Migraine without aura, not intractable, without status migrainosus: Secondary | ICD-10-CM | POA: Diagnosis not present

## 2020-05-16 DIAGNOSIS — R110 Nausea: Secondary | ICD-10-CM | POA: Diagnosis not present

## 2020-05-16 MED ORDER — ONDANSETRON 4 MG PO TBDP: ORAL_TABLET | ORAL | 12 refills | Status: DC

## 2020-05-16 MED ORDER — NURTEC 75 MG PO TBDP: 75.0000 mg | ORAL_TABLET | Freq: Every day | ORAL | 11 refills | Status: DC | PRN

## 2020-05-16 NOTE — Progress Notes (Addendum)
PATIENT: [name] DOB: [birth year]  REASON FOR VISIT: follow up HISTORY FROM: patient  Chief Complaint  Patient presents with  . Follow-up    rm 1 here for a migraie f/u. Pt is having no new sx     HISTORY OF PRESENT ILLNESS: Today 05/16/20 [name] is a 34 y.o. female here today for follow up for migraines. She is doing well. She reports that headache frequency and intensity have significantly reduced since continuing Amovig. She was having some difficulty using the injector. She has not taken Amovig in the past two months. She would like to see how she does without the medication. She has about 5 headache days per month. 2-3 are migrainous. She has significant nausea with bad headaches. Zofran helps. She has tried and failed sumatriptan and Relpax. Zomig helps but causes cold hand and significant fatigue.    HISTORY: (copied from my note on 05/16/2019)  [name] is a 34 y.o. female here today for follow up of migraines. She feels that she is doing well. Amovig is working without adverse effects. She reports about 4 migraines in April,4 in May, and 7 in June. Headaches are mild. Zomig helps. She has not taken Cambia in a while due to being out of medication. She is interested in finding triggers. She has not documented a migraine journal. She does drink caffeine regularly.    HISTORY: (copied from my note on 02/14/2019)  [name] a 34 y.o.femalefor follow up of migraines.She continues Amovig monthly and Zomig for abortive therapy.She reports that she continues to have 4-5 migraine days per month. She has been thinking about adding another preventative therapy to reduce these migraines. She feels that they most often occur while she is at work. She has tried multiple medications in the past including topiramate IR, propranolol, and nortriptyline. She was advised to start Qudexyat a previous visit but opted not to as Aimovig did seem to work. She does feel  that Zomig helps for abortive therapy.   History (copied from Dr [name] note on 02/15/2018)  Interval history: She is on Aimovig and doing well. Since starting Aimovig, severity and frequency decreased, no severe headaches. And less headaches.Discussed options, daily meds, increase Aimovig. Continue acute management.   January: 5 migraine days and 4 headache days February: 4 migraine days March: 3 migraine days  Tried: TopiramateIRand nortriptyline, propranolol, relpax, cambia, zofran, Aimovig, zomig, toradol injections  Interval history 08/17/2017: She uses zomig, works faster nasally, [place] afterwards helps if more severe. In one hour the migraine has resolved. Zomig and cambia work best. She is getting both zomig and cambia. Severity is better with Aimovig. No side effects. She had some numbness and tingling several times. Zofran helps with nausea. Also helps with nausea on non-headache days. She has not started the Qudexy. Will wait and see how Aimovig works. Will hold for now and wait and see. The migraines are not as intense, increased severity and frequency.   June 2 migraine days of headaches July 7 migraine days August 3 migraine days Sep 5 migraine days   Interval history 04/13/2017: Patient is here for follow-up of migraines. She has failed multiple medications as below. She is using relpax and zofran for acute management and may also use a cambia if needed. She did not tolerate propranolol. She has side effects to medications. With Nortriptyline she did not sleep well and was anxious. The Toradol shots work well. But no preventative has worked. Will try Qudexy and also  Erenumab. She has 15 headache days a month and superimposed migraines as below. Discussed teratogenicity of topiramate do not get pregnant. We'll see her back in 4 months. She is to email me so we can slowly increase the topiramate extended release. Stop for any side effects or email me.  January: 5  migraines necessitating Relpax. February 2 migraines, March had 5 migraines and 6 headaches needing treatment of other kind, May 9 migraines necessitating relpax and a few others where she took tylenol or excedin. No medication overuse.  Tried: Topiramate IRand nortriptyline(do not tolerate), propranolol(did not tolerate), relpax(works), zofran, cambia, Zomig nasal spray (worked well)  Interval history 11/19/2016: She has had 2 headaches with vision changes in the right eye and was seen at the ED for severe headache. Relpax helps along with cambia. She has 10 headache days a month and the following number of migraines(below). Mother is here who also has similar headaches. We discussed medications, she is toelrating propranolol well withh slowly increase. Discussed triptan's, Relpax appears to work but may wear off advised her to try Relpax with Cambia and zofran and also discussed Zomig (provided samples of nasal spray) and other triptan's and different ways to administer including nosesprays and powders and injectables. Also discussed Cephaly device and other devices available on the market. Migraines: September 2  October 4 Nov 3 Dec 4  Tried: Topiramate and nortriptyline, now on propranolol  Interval history 07/09/2016:Imitrex not entirely working and Makes neck stiff, will try Relpax next. Weekends are worse. In June had 4 headaches days ranging from 4-6/10 in pain. In July she had 4 mild headaches 2-4/10, moderate 2 6-8/10 and had one very severe. They slowly progress but can get bad. She has had 4 in August one 7/10. She feels dehydration makes them worse. She has difficulty sleeping throughout the night. She has tried imitrex and ibuprofen and tylenol. She has a lot nausea and pills make her sick and difficulty swallowing. Discussed starting a daily preventative and also options for acute management. We'll start nortriptyline at night for prevention. We'll provide Relpax, Zofran and can be  for acute management. Patient is to see me back in 4 months.   Laura [name] a 34 y.o.femalehere as a referral from Dr. Rhona Leavens headaches. Past medical history of asthma, Headaches started for most of her life since high school. Worsening over the last 5-6 years. They are more intense, has to lay down. Usually around the eyes, mostly around the temples. Usually more unilateral then switches to the other side. Throbbing behind the eyes. Light sensitivity, shuts the blinds, lays down with an ice pack. +nausea but no vomiting. She has them on the weekends. 10 headache days a month. 2-3 are migraines. Migraines last the whole day. Gradually gets stronger over a few hours, can get to a 7/10 in pain at its worst on average 3-5/10. Last all day, up to 24 hours. No aura. No dizziness or vertigo or weakness or sensory changes. Mother, aunt, grandmother with migraines. No plans to get pregnant. No medication overuse. Takes Excedrin occ. Or Tylenol but no more than a few times a week. The non-migrainous headaches last just a few hours and they are dull and not too painful. No blurry vision. No aura. No family history of headaches.  Reviewed notes, labs and imaging from outside physicians, which showed: Reviewed notes from OB/GYN. She was started on birth control pills for management of abnormal bleeding, her regulars were regular but she was having midcycle spotting. No  significant dysmenorrhea. Reported she had problems on and up with headaches for some time that it never been evaluated. Not sexually active. No urinary or bowel complaints. Exam was negative, thyroid nonpalpable, breasts normal, lungs clear, regular rate and rhythm without murmurs or gallop, no carotid or abdominal bruits, abdominal exam benign, no masses or organomegaly or tenderness, normal external genitalia, cervix unremarkable, uterus and adnexa normal.    REVIEW OF SYSTEMS: Out of a complete 14 system review of symptoms, the  patient complains only of the following symptoms, headaches, anxiety and all other reviewed systems are negative.   ALLERGIES: Allergies  Allergen Reactions  . Meloxicam Other (See Comments)    Stomach cramps  . Sulfa Antibiotics Hives    HOME MEDICATIONS: Outpatient Medications Prior to Visit  Medication Sig Dispense Refill  . acetaminophen (TYLENOL) 500 MG tablet Take 500 mg by mouth every 6 (six) hours as needed for headache.    . Diclofenac Potassium,Migraine, (CAMBIA) 50 MG PACK MIX ONE PACKET WITH 1-2 OUNCES OF WATER. DRINK IMMEDIATELY AS A SINGLE DOSE. 9 each 5  . doxycycline (VIBRAMYCIN) 50 MG capsule Take 50 mg by mouth every other day.    Eduard Roux (AIMOVIG) 140 MG/ML SOAJ Inject 140 mg into the skin every 30 (thirty) days. 1 pen 11  . PROAIR HFA 108 (90 Base) MCG/ACT inhaler Take 2 puffs by mouth as needed for wheezing or shortness of breath.   2  . zolmitriptan (ZOMIG) 5 MG nasal solution SPRAY 1 SPRAY INTO THE NOSE AS NEEDED FOR MIGRAINE. MAY REPEAT IN 2 HOURS. NO MORE THAN TWICE A DAY 12 Units 5  . ondansetron (ZOFRAN ODT) 4 MG disintegrating tablet Take 1-2 tablet (4-8 mg total) by mouth every 8 (eight) hours as needed for nausea or vomiting. 20 tablet 12  . ketorolac (TORADOL) 30 MG/ML injection Inject 30 mg into the vein once.     No facility-administered medications prior to visit.    PAST MEDICAL HISTORY: Past Medical History:  Diagnosis Date  . 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 GERD (gastroesophageal reflux disease)   . Vitreous detachment of right eye     PAST SURGICAL HISTORY: Past Surgical History:  Procedure Laterality Date  . BREAST BIOPSY Left 04/2017  . WISDOM TOOTH EXTRACTION  2004   x4    FAMILY HISTORY: Family History  Problem Relation Age of Onset  . High blood pressure Mother   . High blood pressure Father   . Diabetes Mellitus II Maternal Grandmother   . Diabetes Mellitus I Maternal Grandfather   . Lung cancer Maternal  Grandfather   . Stroke Paternal Grandfather   . Stroke Paternal Grandmother   . Breast cancer Paternal Aunt     SOCIAL HISTORY: Social History   Socioeconomic History  . Marital status: Single    Spouse name: Not on file  . Number of children: 0  . Years of education: 49  . Highest education level: Not on file  Occupational History  . Occupation: [employer]  Tobacco Use  . Smoking status: Never Smoker  . Smokeless tobacco: Never Used  Substance and Sexual Activity  . Alcohol use: No    Alcohol/week: 0.0 standard drinks  . Drug use: No  . Sexual activity: Not on file    Comment: Stopped BC pills around March 2018  Other Topics Concern  . Not on file  Social History Narrative   Lives with mother and stepfather   Caffeine use: 1 coffee/day  1 soda per day   Social Determinants of Health   Financial Resource Strain:   . Difficulty of Paying Living Expenses:   Food Insecurity:   . Worried About Charity fundraiser in the Last Year:   . Arboriculturist in the Last Year:   Transportation Needs:   . Film/video editor (Medical):   Marland Kitchen Lack of Transportation (Non-Medical):   Physical Activity:   . Days of Exercise per Week:   . Minutes of Exercise per Session:   Stress:   . Feeling of Stress :   Social Connections:   . Frequency of Communication with Friends and Family:   . Frequency of Social Gatherings with Friends and Family:   . Attends Religious Services:   . Active Member of Clubs or Organizations:   . Attends Archivist Meetings:   Marland Kitchen Marital Status:   Intimate Partner Violence:   . Fear of Current or Ex-Partner:   . Emotionally Abused:   Marland Kitchen Physically Abused:   . Sexually Abused:       PHYSICAL EXAM  Vitals:   05/16/20 0926  BP: 123/68  Pulse: 78  Weight: 138 lb (62.6 kg)  Height: 5\' 2"  (1.575 m)   Body mass index is 25.24 kg/m.  Generalized: Well developed, in no acute distress  Neurological examination  Mentation:  Alert oriented to time, place, history taking. Follows all commands speech and language fluent Cranial nerve II-XII: Pupils were equal round reactive to light. Extraocular movements were full, visual field were full  Motor: The motor testing reveals 5 over 5 strength of all 4 extremities. Good symmetric motor tone is noted throughout.  Gait and station: Gait is normal.   DIAGNOSTIC DATA (LABS, IMAGING, TESTING) - I reviewed patient records, labs, notes, testing and imaging myself where available.  No flowsheet data found.   Lab Results  Component Value Date   WBC 4.6 12/09/2016   HGB 13.1 12/09/2016   HCT 39.9 12/09/2016   MCV 84.2 12/09/2016   PLT 225 12/09/2016      Component Value Date/Time   NA 141 12/09/2016 1554   NA 140 03/25/2016 1124   K 4.1 12/09/2016 1554   CL 106 12/09/2016 1554   CO2 28 12/09/2016 1554   GLUCOSE 96 12/09/2016 1554   BUN 8 12/09/2016 1554   BUN 8 03/25/2016 1124   CREATININE 0.73 12/09/2016 1554   CALCIUM 9.7 12/09/2016 1554   PROT 7.2 03/25/2016 1124   ALBUMIN 4.2 03/25/2016 1124   AST 14 03/25/2016 1124   ALT 11 03/25/2016 1124   ALKPHOS 52 03/25/2016 1124   BILITOT 0.6 03/25/2016 1124   GFRNONAA >60 12/09/2016 1554   GFRAA >60 12/09/2016 1554   No results found for: CHOL, HDL, LDLCALC, LDLDIRECT, TRIG, CHOLHDL No results found for: HGBA1C No results found for: VITAMINB12 No results found for: TSH     ASSESSMENT AND PLAN 34 y.o. year old female  has a past medical history of Fibroadenoma of breast (04/2017), GERD (gastroesophageal reflux disease), and Vitreous detachment of right eye. here with     ICD-10-CM   1. Migraine without aura and without status migrainosus, not intractable  G43.009 ondansetron (ZOFRAN ODT) 4 MG disintegrating tablet  2. Nausea without vomiting  R11.0 ondansetron (ZOFRAN ODT) 4 MG disintegrating tablet    Dawson is doing well today. She would like to discontinue Amovig for now as she has not taken in 2-3  months and headaches are manageable. We  will add Nurtec for abortive therapy as she is having more side effects with Zomig. I have educated her on appropriate administration and possible side effects. She was advised not to use abortive medications regularly. Will consider Ajovy in the future if needed. We will refill Zofran for nausea. Healthy lifestyle habits encouraged. She will follow up in 1 year, sooner if needed. She verbalizes understanding and agreement with this plan.    No orders of the defined types were placed in this encounter.    Meds ordered this encounter  Medications  . Rimegepant Sulfate (NURTEC) 75 MG TBDP    Sig: Take 75 mg by mouth daily as needed (take for abortive therapy of migraine, no more than 1 tablet in 24 hours or 10 per month).    Dispense:  8 tablet    Refill:  11    Order Specific Question:   Supervising Provider    Answer:   Melvenia Beam [id number]  . ondansetron (ZOFRAN ODT) 4 MG disintegrating tablet    Sig: Take 1-2 tablet (4-8 mg total) by mouth every 8 (eight) hours as needed for nausea or vomiting.    Dispense:  20 tablet    Refill:  12    Order Specific Question:   Supervising Provider    Answer:   Melvenia Beam [id number]      I spent 15 minutes with the patient. 50% of this time was spent counseling and educating patient on plan of care and medications.    Debbora Presto, FNP-C 05/16/2020, 10:33 AM [facility] [address], [place], [place] [address] [phone]  Made any corrections needed, and agree with history, physical, neuro exam,assessment and plan as stated.     Sarina Ill, MD [facility]

## 2020-05-16 NOTE — Patient Instructions (Signed)
We will discontinue Amovig for now. Monitor headaches closely.   We will try Nurtec for abortive therapy. You can also use Zofran for nausea. We will keep Zomig and diclofenac on your medication list for now but try not to use abortive medications on a regular basis. We can consider restarting CGRP injections if needed.   Stay well hydrated. Well balanced diet and regular exercise advised.   Follow up in 1 year, sooner if needed.    Migraine Headache A migraine headache is a very strong throbbing pain on one side or both sides of your head. This type of headache can also cause other symptoms. It can last from 4 hours to 3 days. Talk with your doctor about what things may bring on (trigger) this condition. What are the causes? The exact cause of this condition is not known. This condition may be triggered or caused by:  Drinking alcohol.  Smoking.  Taking medicines, such as: ? Medicine used to treat chest pain (nitroglycerin). ? Birth control pills. ? Estrogen. ? Some blood pressure medicines.  Eating or drinking certain products.  Doing physical activity. Other things that may trigger a migraine headache include:  Having a menstrual period.  Pregnancy.  Hunger.  Stress.  Not getting enough sleep or getting too much sleep.  Weather changes.  Tiredness (fatigue). What increases the risk?  Being 39-40 years old.  Being female.  Having a family history of migraine headaches.  Being Caucasian.  Having depression or anxiety.  Being very overweight. What are the signs or symptoms?  A throbbing pain. This pain may: ? Happen in any area of the head, such as on one side or both sides. ? Make it hard to do daily activities. ? Get worse with physical activity. ? Get worse around bright lights or loud noises.  Other symptoms may include: ? Feeling sick to your stomach (nauseous). ? Vomiting. ? Dizziness. ? Being sensitive to bright lights, loud noises, or  smells.  Before you get a migraine headache, you may get warning signs (an aura). An aura may include: ? Seeing flashing lights or having blind spots. ? Seeing bright spots, halos, or zigzag lines. ? Having tunnel vision or blurred vision. ? Having numbness or a tingling feeling. ? Having trouble talking. ? Having weak muscles.  Some people have symptoms after a migraine headache (postdromal phase), such as: ? Tiredness. ? Trouble thinking (concentrating). How is this treated?  Taking medicines that: ? Relieve pain. ? Relieve the feeling of being sick to your stomach. ? Prevent migraine headaches.  Treatment may also include: ? Having acupuncture. ? Avoiding foods that bring on migraine headaches. ? Learning ways to control your body functions (biofeedback). ? Therapy to help you know and deal with negative thoughts (cognitive behavioral therapy). Follow these instructions at home: Medicines  Take over-the-counter and prescription medicines only as told by your doctor.  Ask your doctor if the medicine prescribed to you: ? Requires you to avoid driving or using heavy machinery. ? Can cause trouble pooping (constipation). You may need to take these steps to prevent or treat trouble pooping:  Drink enough fluid to keep your pee (urine) pale yellow.  Take over-the-counter or prescription medicines.  Eat foods that are high in fiber. These include beans, whole grains, and fresh fruits and vegetables.  Limit foods that are high in fat and sugar. These include fried or sweet foods. Lifestyle  Do not drink alcohol.  Do not use any products that contain  nicotine or tobacco, such as cigarettes, e-cigarettes, and chewing tobacco. If you need help quitting, ask your doctor.  Get at least 8 hours of sleep every night.  Limit and deal with stress. General instructions      Keep a journal to find out what may bring on your migraine headaches. For example, write down: ? What  you eat and drink. ? How much sleep you get. ? Any change in what you eat or drink. ? Any change in your medicines.  If you have a migraine headache: ? Avoid things that make your symptoms worse, such as bright lights. ? It may help to lie down in a dark, quiet room. ? Do not drive or use heavy machinery. ? Ask your doctor what activities are safe for you.  Keep all follow-up visits as told by your doctor. This is important. Contact a doctor if:  You get a migraine headache that is different or worse than others you have had.  You have more than 15 headache days in one month. Get help right away if:  Your migraine headache gets very bad.  Your migraine headache lasts longer than 72 hours.  You have a fever.  You have a stiff neck.  You have trouble seeing.  Your muscles feel weak or like you cannot control them.  You start to lose your balance a lot.  You start to have trouble walking.  You pass out (faint).  You have a seizure. Summary  A migraine headache is a very strong throbbing pain on one side or both sides of your head. These headaches can also cause other symptoms.  This condition may be treated with medicines and changes to your lifestyle.  Keep a journal to find out what may bring on your migraine headaches.  Contact a doctor if you get a migraine headache that is different or worse than others you have had.  Contact your doctor if you have more than 15 headache days in a month. This information is not intended to replace advice given to you by your health care provider. Make sure you discuss any questions you have with your health care provider. Document Revised: 02/18/2019 Document Reviewed: 12/09/2018 Elsevier Patient Education  [place].

## 2020-05-22 DIAGNOSIS — L718 Other rosacea: Secondary | ICD-10-CM | POA: Diagnosis not present

## 2020-06-08 DIAGNOSIS — M5412 Radiculopathy, cervical region: Secondary | ICD-10-CM | POA: Diagnosis not present

## 2020-06-29 ENCOUNTER — Encounter: Payer: Self-pay | Admitting: Family Medicine

## 2020-06-29 DIAGNOSIS — G43009 Migraine without aura, not intractable, without status migrainosus: Secondary | ICD-10-CM

## 2020-07-02 MED ORDER — UBRELVY 100 MG PO TABS
100.0000 mg | ORAL_TABLET | Freq: Every day | ORAL | 11 refills | Status: DC | PRN
Start: 2020-07-02 — End: 2021-06-05

## 2020-07-02 MED ORDER — ZOLMITRIPTAN 5 MG NA SOLN: NASAL | 11 refills | Status: DC

## 2020-07-03 NOTE — Telephone Encounter (Signed)
Just an FYI, Zomig was approved through [place] effective from 01/23/2020 through 01/21/2023.

## 2020-07-04 ENCOUNTER — Telehealth: Payer: Self-pay

## 2020-07-04 NOTE — Telephone Encounter (Signed)
A PA for [name] has been sent via [place] Request has been approved Effective from 07/04/2020 through 09/25/2020. Faxed to pharmacy

## 2020-07-05 DIAGNOSIS — L718 Other rosacea: Secondary | ICD-10-CM | POA: Diagnosis not present

## 2020-07-05 DIAGNOSIS — L700 Acne vulgaris: Secondary | ICD-10-CM | POA: Diagnosis not present

## 2020-07-24 DIAGNOSIS — Z01419 Encounter for gynecological examination (general) (routine) without abnormal findings: Secondary | ICD-10-CM | POA: Diagnosis not present

## 2020-07-24 DIAGNOSIS — Z6825 Body mass index (BMI) 25.0-25.9, adult: Secondary | ICD-10-CM | POA: Diagnosis not present

## 2020-07-26 DIAGNOSIS — Z1231 Encounter for screening mammogram for malignant neoplasm of breast: Secondary | ICD-10-CM | POA: Diagnosis not present

## 2020-08-29 DIAGNOSIS — R52 Pain, unspecified: Secondary | ICD-10-CM | POA: Diagnosis not present

## 2020-08-29 DIAGNOSIS — Z03818 Encounter for observation for suspected exposure to other biological agents ruled out: Secondary | ICD-10-CM | POA: Diagnosis not present

## 2020-08-29 DIAGNOSIS — R509 Fever, unspecified: Secondary | ICD-10-CM | POA: Diagnosis not present

## 2020-09-17 ENCOUNTER — Telehealth: Payer: Self-pay | Admitting: Family Medicine

## 2020-09-17 NOTE — Telephone Encounter (Signed)
Rep. From Cover My Meds is asking for PA for Ubrogepant (UBRELVY) 100 MG TABS  Ref. Key: [id number]

## 2020-09-19 NOTE — Telephone Encounter (Signed)
PA submitted for UBRELVY 100mg  on Cover my Meds. There is a 24/72hr turn around. Key: [id number] Status: PENDING

## 2020-09-28 ENCOUNTER — Encounter: Payer: Self-pay | Admitting: Family Medicine

## 2020-09-28 NOTE — Telephone Encounter (Signed)
[employer] approved ubrelvy, 09/19/20 - 09/18/21.

## 2020-10-01 ENCOUNTER — Other Ambulatory Visit: Payer: Self-pay | Admitting: Family Medicine

## 2020-10-01 DIAGNOSIS — G43009 Migraine without aura, not intractable, without status migrainosus: Secondary | ICD-10-CM

## 2020-10-01 MED ORDER — ZOMIG 5 MG NA SOLN: 1.0000 | NASAL | 5 refills | Status: DC | PRN

## 2020-10-01 MED ORDER — AJOVY 225 MG/1.5ML ~~LOC~~ SOAJ: 225.0000 mg | SUBCUTANEOUS | 3 refills | Status: DC

## 2020-11-04 DIAGNOSIS — Z20828 Contact with and (suspected) exposure to other viral communicable diseases: Secondary | ICD-10-CM | POA: Diagnosis not present

## 2020-11-05 ENCOUNTER — Telehealth: Payer: Self-pay

## 2020-11-05 NOTE — Telephone Encounter (Signed)
I have submitted PA request for Ajovy to [facility], Key: [id number].   Awaiting determination from [employer].

## 2020-12-03 ENCOUNTER — Telehealth: Payer: Self-pay

## 2020-12-03 NOTE — Telephone Encounter (Signed)
PA for Ajovy has been approved instantly through cover my meds.   (Key: [id number])  This request has received a Favorable outcome from [place].  Please keep in mind this is not a guarantee of payment. Eligibility and Benefit determinations will be made at the time of service.  Please note any additional information provided by [facility] at the bottom of the screen.  12/03/2020 through 02/24/2021

## 2020-12-05 ENCOUNTER — Encounter: Payer: Self-pay | Admitting: Family Medicine

## 2020-12-07 ENCOUNTER — Encounter: Payer: Self-pay | Admitting: Family Medicine

## 2020-12-10 ENCOUNTER — Other Ambulatory Visit: Payer: Self-pay | Admitting: *Deleted

## 2020-12-10 DIAGNOSIS — G43009 Migraine without aura, not intractable, without status migrainosus: Secondary | ICD-10-CM

## 2020-12-10 MED ORDER — CAMBIA 50 MG PO PACK: PACK | ORAL | 5 refills | Status: DC

## 2020-12-11 DIAGNOSIS — L718 Other rosacea: Secondary | ICD-10-CM | POA: Diagnosis not present

## 2020-12-11 DIAGNOSIS — L700 Acne vulgaris: Secondary | ICD-10-CM | POA: Diagnosis not present

## 2021-01-23 ENCOUNTER — Telehealth: Payer: Self-pay | Admitting: *Deleted

## 2021-01-23 ENCOUNTER — Encounter: Payer: Self-pay | Admitting: Family Medicine

## 2021-01-23 NOTE — Telephone Encounter (Signed)
PA approved effective from 01/23/2021 through 01/22/2022.

## 2021-01-23 NOTE — Telephone Encounter (Signed)
Submitted PA Cambia on C[employer]. Key: [id number]. Waiting on determination from [place]

## 2021-01-25 DIAGNOSIS — R29898 Other symptoms and signs involving the musculoskeletal system: Secondary | ICD-10-CM | POA: Diagnosis not present

## 2021-02-21 DIAGNOSIS — G5601 Carpal tunnel syndrome, right upper limb: Secondary | ICD-10-CM | POA: Diagnosis not present

## 2021-02-27 DIAGNOSIS — M79601 Pain in right arm: Secondary | ICD-10-CM | POA: Diagnosis not present

## 2021-03-07 ENCOUNTER — Encounter: Payer: Self-pay | Admitting: Family Medicine

## 2021-03-07 DIAGNOSIS — G5601 Carpal tunnel syndrome, right upper limb: Secondary | ICD-10-CM | POA: Diagnosis not present

## 2021-03-13 ENCOUNTER — Encounter: Payer: Self-pay | Admitting: Family Medicine

## 2021-03-13 ENCOUNTER — Telehealth: Payer: Self-pay | Admitting: *Deleted

## 2021-03-13 NOTE — Telephone Encounter (Signed)
Ajovy PA, key: [id number], [id number] Your information has been submitted to [place]. [employer] [place] will review the request and notify you of the determination decision directly, typically within 72 hours of receiving all information. You will also receive your request decision electronically.  If [employer] has not responded within the specified timeframe or if you have any questions about your PA submission, contact [place] directly at [phone].

## 2021-03-14 NOTE — Telephone Encounter (Signed)
Per Cover My Meds, Ajovy approved.   Effective from 03/13/2021 through 03/12/2022.

## 2021-04-15 DIAGNOSIS — F4010 Social phobia, unspecified: Secondary | ICD-10-CM | POA: Diagnosis not present

## 2021-04-15 DIAGNOSIS — K219 Gastro-esophageal reflux disease without esophagitis: Secondary | ICD-10-CM | POA: Diagnosis not present

## 2021-04-15 DIAGNOSIS — Z13220 Encounter for screening for lipoid disorders: Secondary | ICD-10-CM | POA: Diagnosis not present

## 2021-04-15 DIAGNOSIS — L719 Rosacea, unspecified: Secondary | ICD-10-CM | POA: Diagnosis not present

## 2021-04-15 DIAGNOSIS — Z0000 Encounter for general adult medical examination without abnormal findings: Secondary | ICD-10-CM | POA: Diagnosis not present

## 2021-04-22 DIAGNOSIS — H16423 Pannus (corneal), bilateral: Secondary | ICD-10-CM | POA: Diagnosis not present

## 2021-04-22 DIAGNOSIS — H5213 Myopia, bilateral: Secondary | ICD-10-CM | POA: Diagnosis not present

## 2021-04-22 DIAGNOSIS — H04123 Dry eye syndrome of bilateral lacrimal glands: Secondary | ICD-10-CM | POA: Diagnosis not present

## 2021-04-23 DIAGNOSIS — L700 Acne vulgaris: Secondary | ICD-10-CM | POA: Diagnosis not present

## 2021-04-23 DIAGNOSIS — L718 Other rosacea: Secondary | ICD-10-CM | POA: Diagnosis not present

## 2021-05-20 ENCOUNTER — Ambulatory Visit: Payer: BC Managed Care – PPO | Admitting: Family Medicine

## 2021-06-05 ENCOUNTER — Encounter: Payer: Self-pay | Admitting: Neurology

## 2021-06-05 ENCOUNTER — Ambulatory Visit: Payer: BC Managed Care – PPO | Admitting: Neurology

## 2021-06-05 VITALS — BP 119/81 | HR 80 | Ht 62.0 in | Wt 137.0 lb

## 2021-06-05 DIAGNOSIS — R258 Other abnormal involuntary movements: Secondary | ICD-10-CM | POA: Diagnosis not present

## 2021-06-05 DIAGNOSIS — R29898 Other symptoms and signs involving the musculoskeletal system: Secondary | ICD-10-CM | POA: Diagnosis not present

## 2021-06-05 DIAGNOSIS — G959 Disease of spinal cord, unspecified: Secondary | ICD-10-CM | POA: Diagnosis not present

## 2021-06-05 DIAGNOSIS — R292 Abnormal reflex: Secondary | ICD-10-CM

## 2021-06-05 DIAGNOSIS — R202 Paresthesia of skin: Secondary | ICD-10-CM

## 2021-06-05 DIAGNOSIS — H547 Unspecified visual loss: Secondary | ICD-10-CM

## 2021-06-05 DIAGNOSIS — R200 Anesthesia of skin: Secondary | ICD-10-CM | POA: Insufficient documentation

## 2021-06-05 DIAGNOSIS — R29818 Other symptoms and signs involving the nervous system: Secondary | ICD-10-CM

## 2021-06-05 NOTE — Patient Instructions (Signed)
MRI brain and cervical w/wo contrast May need to repeat EMG/NCS

## 2021-06-05 NOTE — Progress Notes (Signed)
[facility]    Provider:  Dr Jaynee Eagles Requesting Provider: Scifres, Durel Salts Primary Care Provider:  Maury Dus, MD  CC:  right hand weakness  HPI:  [name] is a 35 y.o. female here as requested by Scifres, Earlie Server, PA-C for right hand weakness.  Past medical history bronchospasm, GERD, leukopenia, migraine, gastritis, rosacea.  We have seen patient in the past for migraines today she is here for a new chief complaint as requested by Dr. Lawanna Kobus office, right hand weakness,   Patient states she has right hand numbness and weakness, she has had problems since she was younger but has had more trouble since January 2022, getting harder to hold things, she has pain in her wrist which has improved however is mostly weak, sometimes the symptoms extend into her forearm, and the muscles feel tight in that area, and nerve study completed which did not show evidence of CTS. Started as a child, as a teenager she wrote a lot and she had weakness turning the ignition due to pain, she was given anti-inflammatories, would come ang for years, recently more weakness than pain, pain in the wrist a sharp pain, mostly she gets very tense and her arm is tired a lot the forarm, the elbow and shoulder is fine, mostly in the hands, all the fingers feel numb, worse after she uses it, she has some neckpain but no radicular symptoms, she has an emg in April and it was normal. No injuries. If she sleeps on it wrong she has pins and needles. Worse during her menses she has muscle pain all over. No neuromuscular disorders in the family. Several years ago she had left-sided neckpain and into her collar bone and her whole left arm went numb, she went to the ED, that slowly resolved. Prednisone helped. No temperature differences. No other focal neurologic deficits, associated symptoms, inciting events or modifiable factors.   I reviewed Dr. Cyndi Lennert and Earlie Server scifres' notes as well as  orthopedics: She has right hand pain and weakness, she has a long history of right wrist and arm pain, she has noticed more weakness and muscle fatigue recently in the last few weeks, no change in activities,, she is right-handed and writes and types frequently for work, she notices more weakness when trying to grab something or hold onto something heavy, noting her forearm feels "wobbly", she feels some numbness on the posterior hand occasionally, of note she has had neck and shoulder pain off and on for the past 2 years, CT scan of the neck in 2018 was unremarkable, she currently denies neck pain.  As far as migraines go she continues to take Ajovy, diclofenac, and to [place], Tylenol, Zomig.  On examination she has full active range of motion, strength 5 out of 5, mild tenderness over the right posterior hand with soft tissue bulge at the carpal bones noted with wrist extension not present in left hand, no bony tenderness, sensation to light touch.  She was referred to orthopedics.  In the past as reviewing her records she is also had problems with left cervical radiculopathy in 2021.  In 2020 she also had back pain midline numb and tingly in that area I reviewed [facility] notes reporting right hand weakness numbness note from April 2022 and diagnosed with carpal tunnel syndrome and it appears she had an EMG, and they wanted her to speak with neurology about some of the abnormal muscle movements and the sensation she can getting in terms of the  tightness in the arm, if her symptoms worsen they may consider diagnostic therapeutic injections.  She was advised to continue bracing there was referral to an EMG nerve conduction study, who stated it was normal, did not show carpal tunnel syndrome, follow-up with neurology.  As far as labs go, date of service April 12, 2020, CMP was unremarkable with creatinine 0.58, BUN 8, T bili 1.3, CBC showed slightly lowered white blood cells at 3.8 otherwise unremarkable, hep C was  negative.  I reviewed EMG nerve conduction study data and report all nerve conduction studies and muscles examined were normal this included the right median ENT sensory, right radial antisensory, right ulnar antisensory, right median motor, right ulnar motor and right-sided muscles including: Biceps, deltoid, triceps, first dorsal interosseous, abductor pollicis brevis, pronator teres, brachioradialis, flexor pollicis longus all normal.  The right median 3.6, 26.9 the right radial 2.3, 35.5, the right ulnar 3.4, 34.1, the right median 3.4 ms, 11.3 mV 56 m/s, right ulnar 34 ms, 12 mV, 61 and 67 m/s.  Patient complains of symptoms per HPI as well as the following symptoms: hand weakness . Pertinent negatives and positives per HPI. All others negative   PRIOR HISTORY:     Interval history 02/15/2018: She is on Aimovig and doing well.  Since starting Aimovig, severity and frequency decreased, no severe headaches. And less headaches. Discussed options, daily meds, increase Aimovig. Continue acute management.   January: 5 migraine days and 4 headache days February: 4 migraine days March: 3 migraine days   Tried: Topiramate IR and nortriptyline, propranolol, relpax, cambia, zofran, Aimovig, zomig, toradol injections  Interval history 08/17/2017:  She uses zomig, works faster nasally, [place] afterwards helps if more severe. In one hour the migraine has resolved. Zomig and cambia work best. She is getting both zomig and cambia. Severity is better with Aimovig. No side effects. She had some numbness and tingling several times. Zofran helps with nausea. Also helps with nausea on non-headache days. She has not started the Qudexy. Will wait and see how Aimovig works. Will hold for now and wait and see. The migraines are not as intense, increased severity and frequency.   June 2 migraine days of headaches July 7 migraine days August 3 migraine days Sep 5 migraine days    Interval history 04/13/2017:   Patient is here for follow-up of migraines. She has failed multiple medications as below. She is using relpax and zofran for acute management and may also use a cambia if needed. She did not tolerate propranolol. She has side effects to medications. With Nortriptyline she did not sleep well and was anxious. The Toradol shots work well. But no preventative has worked.  Will try Qudexy and also Erenumab. She has 15 headache days a month and superimposed migraines as below. Discussed teratogenicity of topiramate do not get pregnant. We'll see her back in 4 months. She is to email me so we can slowly increase the topiramate extended release. Stop for any side effects or email me.   January: 5 migraines necessitating Relpax. February 2 migraines, March had 5 migraines and 6 headaches needing treatment of other kind, May 9 migraines necessitating relpax and a few others where she took tylenol or excedin. No medication overuse.   Tried: Topiramate IR and nortriptyline(do not tolerate), propranolol(did not tolerate), relpax(works), zofran, cambia, Zomig nasal spray (worked well)   Interval history 11/19/2016:  She has had 2 headaches with vision changes in the right eye and was seen at the ED  for severe headache. Relpax helps along with cambia. She has 10 headache days a month and the following number of migraines(below). Mother is here who also has similar headaches. We discussed medications, she is toelrating propranolol well withh slowly increase. Discussed triptan's, Relpax appears to work but may wear off advised her to try Relpax with Cambia and zofran and also discussed Zomig (provided samples of nasal spray) and other triptan's and different ways to administer including nose sprays and powders and injectables. Also discussed Cephaly device and other devices available on the market. Migraines: September 2  October 4 Nov 3 Dec 4       Tried: Topiramate and nortriptyline, now on propranolol   Interval  history 07/09/2016: Imitrex not entirely working and  Makes neck stiff, will try Relpax next. Weekends are worse. In June had 4 headaches days ranging from 4-6/10 in pain. In July she had 4 mild headaches 2-4/10, moderate 2 6-8/10 and had one very severe. They slowly progress but can get bad. She has had 4 in August one 7/10. She feels dehydration makes them worse. She has difficulty sleeping throughout the night. She has tried imitrex and ibuprofen and tylenol. She has a lot nausea and pills make her sick and difficulty swallowing. Discussed starting a daily preventative and also options for acute management. We'll start nortriptyline at night for prevention. We'll provide Relpax, Zofran and can be for acute management. Patient is to see me back in 4 months.     HPI:  [name] is a 35 y.o. female here as a referral from Dr. Radene Knee for headaches. Past medical history of asthma, Headaches started for most of her life since high school. Worsening over the last 5-6 years. They are more intense, has to lay down. Usually around the eyes, mostly around the temples. Usually more unilateral then switches to the other side. Throbbing behind the eyes. Light sensitivity, shuts the blinds, lays down with an ice pack. +nausea but no vomiting. She has them on the weekends. 10 headache days a month.  2-3 are migraines. Migraines last the whole day. Gradually gets stronger over a few hours, can get to a 7/10 in pain at its worst on average 3-5/10. Last all day, up to 24 hours. No aura. No dizziness or vertigo or weakness or sensory changes. Mother, aunt, grandmother with migraines. No plans to get pregnant. No medication overuse. Takes Excedrin occ. Or Tylenol but no more than a few times a week. The non-migrainous headaches last just a few hours and they are dull and not too painful.  No blurry vision. No aura. No family history of headaches.   Reviewed notes, labs and imaging from outside physicians, which showed:  Reviewed notes from OB/GYN. She was started on birth control pills for management of abnormal bleeding, her regulars were regular but she was having midcycle spotting. No significant dysmenorrhea. Reported she had problems on and up with headaches for some time that it never been evaluated. Not sexually active. No urinary or bowel complaints. Exam was negative, thyroid nonpalpable, breasts normal, lungs clear, regular rate and rhythm without murmurs or gallop, no carotid or abdominal bruits, abdominal exam benign, no masses or organomegaly or tenderness, normal external genitalia, cervix unremarkable, uterus and adnexa normal.   Review of Systems: Patient complains of symptoms per HPI as well as the following symptoms hand pain. Pertinent negatives and positives per HPI. All others negative.   Social History   Socioeconomic History   Marital status: Single  Spouse name: Not on file   Number of children: 0   Years of education: 65   Highest education level: Not on file  Occupational History   Occupation: [employer]  Tobacco Use   Smoking status: Never   Smokeless tobacco: Never  Vaping Use   Vaping Use: Never used  Substance and Sexual Activity   Alcohol use: No    Alcohol/week: 0.0 standard drinks   Drug use: No   Sexual activity: Not on file    Comment: Stopped BC pills around March 2018  Other Topics Concern   Not on file  Social History Narrative   Lives with mother and stepfather   Caffeine use: 1 coffee/day   1 soda per day   Right handed   Social Determinants of Health   Financial Resource Strain: Not on file  Food Insecurity: Not on file  Transportation Needs: Not on file  Physical Activity: Not on file  Stress: Not on file  Social Connections: Not on file  Intimate Partner Violence: Not on file    Family History  Problem Relation Age of Onset   High blood pressure Mother    High blood pressure Father    Diabetes Mellitus II Maternal Grandmother     Diabetes Mellitus I Maternal Grandfather    Lung cancer Maternal Grandfather    Stroke Paternal Grandfather    Stroke Paternal Grandmother    Breast cancer Paternal Aunt     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     Azelaic Acid 15 % gel Apply 1 application topically at bedtime.     clindamycin (CLEOCIN T) 1 % SWAB Apply 1 application topically every morning.     Diclofenac Potassium,Migraine, (CAMBIA) 50 MG PACK MIX ONE PACKET WITH 1-2 OUNCES OF WATER. DRINK IMMEDIATELY AS A SINGLE DOSE. 9 each 5   Fremanezumab-vfrm (AJOVY) 225 MG/1.5ML SOAJ Inject 225 mg into the skin every 30 (thirty) days. 4.5 mL 3   ondansetron (ZOFRAN ODT) 4 MG disintegrating tablet Take 1-2 tablet (4-8 mg total) by mouth every 8 (eight) hours as needed for nausea or vomiting. 20 tablet 12   ZOMIG 5 MG nasal solution Place 1 spray into the nose as needed for migraine. 12 each 5   No current facility-administered medications for this visit.    Allergies as of 06/05/2021 - Review Complete 06/05/2021  Allergen Reaction Noted   Meloxicam Other (See Comments) 03/25/2016   Sulfa antibiotics Hives 03/25/2016    Vitals: BP 119/81 (BP Location: Right Arm, Patient Position: Sitting)   Pulse 80   Ht '5\' 2"'$  (1.575 m)   Wt 137 lb (62.1 kg)   BMI 25.06 kg/m  Last Weight:  Wt Readings from Last 1 Encounters:  06/05/21 137 lb (62.1 kg)   Last Height:    Ht Readings from Last 1 Encounters:  06/05/21 5'  2" (1.575 m)     Physical exam: Exam: Gen: NAD, conversant, well nourised, well groomed                     CV: RRR, no MRG. No Carotid Bruits. No peripheral edema, warm, nontender Eyes: Conjunctivae clear without exudates or hemorrhage  Neuro: Detailed Neurologic Exam  Speech:    Speech is normal; fluent and spontaneous with normal comprehension.  Cognition:    The patient is oriented to person, place, and time;     recent and remote memory intact;     language fluent;     normal attention, concentration,     fund of knowledge Cranial Nerves:    The pupils are equal, round, and reactive to light. The fundi are normal and spontaneous venous pulsations are present. Visual fields are full to finger confrontation. Extraocular movements are intact. Trigeminal sensation is intact and the muscles of mastication are normal. The face is symmetric. The palate elevates in the midline. Hearing intact. Voice is normal. Shoulder shrug is normal. The tongue has normal motion without fasciculations.   Coordination:    Normal finger to nose and heel to shin. Normal rapid alternating movements.   Gait:    Heel-toe and tandem gait are normal.   Motor Observation:    No asymmetry, no atrophy, and no involuntary movements noted. Tone:    Normal muscle tone.    Posture:    Posture is normal. normal erect    Strength: weakness right arm triceps and pronator teres. Weak grip.     Strength is V/V in the upper and lower limbs.      Sensation: intact to LT     Reflex Exam:  DTR's:    Deep tendon reflexes in the upper and lower extremities are brick right > left bilaterally.   Toes:    The toes are downgoing bilaterally.   Clonus:    Clonus 2 beats AJs right    Assessment/Plan:  Hand weakness since a child, waxes and wanes, now progressively worsening, also had episodes in the past of left arm numbness (improved with prednisone)need MRI  brain and cervical spine to evaluate for multiple sclerosis. She has had a few episodes of vision changes, possible auras but need to evaluate, all of these symptoms can be seen in a demyelinating disorder.  Exam has focal neurologic issues including 2 beats clonus, brisk reflexes more so on the right, weakness of the right arm.  MRIof the brain and cervical spine w/wo contrast SATER TO READ EMG/NCS BOTH uppers - may need to repeat or if answers found above can cancel OT right hand weakness, fune motor difficulty  Orders Placed This Encounter  Procedures   MR BRAIN W WO CONTRAST   MR CERVICAL SPINE W WO CONTRAST   CK   Comprehensive metabolic panel   CBC with Differential/Platelets   Ambulatory referral to Occupational Therapy   NCV with EMG(electromyography)   No orders of the defined types were placed in this encounter.   Cc: Scifres, Durel Salts,  Maury Dus, MD  Sarina Ill, MD  [facility] [address] [place], [place] [address]  Phone [phone] Fax [phone]

## 2021-06-06 LAB — COMPREHENSIVE METABOLIC PANEL
ALT: 11 IU/L (ref 0–32)
AST: 14 IU/L (ref 0–40)
Albumin/Globulin Ratio: 2 (ref 1.2–2.2)
Albumin: 4.7 g/dL (ref 3.8–4.8)
Alkaline Phosphatase: 49 IU/L (ref 44–121)
BUN/Creatinine Ratio: 9 (ref 9–23)
BUN: 6 mg/dL (ref 6–20)
Bilirubin Total: 0.8 mg/dL (ref 0.0–1.2)
CO2: 22 mmol/L (ref 20–29)
Calcium: 9.3 mg/dL (ref 8.7–10.2)
Chloride: 104 mmol/L (ref 96–106)
Creatinine, Ser: 0.68 mg/dL (ref 0.57–1.00)
Globulin, Total: 2.3 g/dL (ref 1.5–4.5)
Glucose: 84 mg/dL (ref 65–99)
Potassium: 4.3 mmol/L (ref 3.5–5.2)
Sodium: 142 mmol/L (ref 134–144)
Total Protein: 7 g/dL (ref 6.0–8.5)
eGFR: 116 mL/min/{1.73_m2} (ref >59)

## 2021-06-06 LAB — CBC WITH DIFFERENTIAL/PLATELET
Basophils Absolute: 0.1 10*3/uL (ref 0.0–0.2)
Basos: 2 %
EOS (ABSOLUTE): 0.1 10*3/uL (ref 0.0–0.4)
Eos: 5 %
Hematocrit: 39.4 % (ref 34.0–46.6)
Hemoglobin: 13.4 g/dL (ref 11.1–15.9)
Immature Grans (Abs): 0 10*3/uL (ref 0.0–0.1)
Immature Granulocytes: 0 %
Lymphocytes Absolute: 1.3 10*3/uL (ref 0.7–3.1)
Lymphs: 41 %
MCH: 30 pg (ref 26.6–33.0)
MCHC: 34 g/dL (ref 31.5–35.7)
MCV: 88 fL (ref 79–97)
Monocytes Absolute: 0.3 10*3/uL (ref 0.1–0.9)
Monocytes: 11 %
Neutrophils Absolute: 1.3 10*3/uL — ABNORMAL LOW (ref 1.4–7.0)
Neutrophils: 41 %
Platelets: 232 10*3/uL (ref 150–450)
RBC: 4.46 x10E6/uL (ref 3.77–5.28)
RDW: 11.7 % (ref 11.7–15.4)
WBC: 3.1 10*3/uL — ABNORMAL LOW (ref 3.4–10.8)

## 2021-06-06 LAB — CK: Total CK: 54 U/L (ref 32–182)

## 2021-06-10 ENCOUNTER — Other Ambulatory Visit: Payer: Self-pay

## 2021-06-10 ENCOUNTER — Ambulatory Visit: Payer: BC Managed Care – PPO | Attending: Neurology | Admitting: Occupational Therapy

## 2021-06-10 ENCOUNTER — Encounter: Payer: Self-pay | Admitting: Occupational Therapy

## 2021-06-10 DIAGNOSIS — R202 Paresthesia of skin: Secondary | ICD-10-CM | POA: Insufficient documentation

## 2021-06-10 DIAGNOSIS — R29818 Other symptoms and signs involving the nervous system: Secondary | ICD-10-CM | POA: Insufficient documentation

## 2021-06-10 DIAGNOSIS — M6281 Muscle weakness (generalized): Secondary | ICD-10-CM | POA: Diagnosis not present

## 2021-06-10 DIAGNOSIS — R29898 Other symptoms and signs involving the musculoskeletal system: Secondary | ICD-10-CM | POA: Diagnosis not present

## 2021-06-11 NOTE — Therapy (Signed)
[place]. [place], [place], [address] Phone: [phone]   Fax:  [phone]  Occupational Therapy Evaluation  Patient Details  Name: [name] MRN: [id number] Date of Birth: [birth year] Referring Provider (OT): Sarina Ill, MD   Encounter Date: 06/10/2021   OT End of Session - 06/10/21 1533     Visit Number 1    Number of Visits 7   Date for OT Re-Evaluation 09/08/2021   Authorization Type BCBS    Authorization - Number of Visits 30   OT Start Time 1530    OT Stop Time [id number]    OT Time Calculation (min) 45 min    Activity Tolerance Patient tolerated treatment well    Behavior During Therapy 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   Subjective Assessment - 06/10/21 1530     Subjective  Pt arrives to session today w/ primary concerns related to weakness and fatigue in her R hand; "I don't really have a lot of pain." Pt states she has had similar symptoms since she was a teenager, but has noticed it getting worse over the past few months. She reports difficulty w/ lifting/gripping heavier objects and significant weakness/fatigue, which is particularly noticeable/limiting after a day at work.   Pertinent History PMHx includes migraine, leukopenia, bronchospasm, and GERD   Limitations Weakness and occasional altered sensation in R, dominant hand    Patient Stated Goals Cooking, lifting things without dropping them, opening containers, write more like I used to    Currently in Pain? Yes    Pain Score 2     Pain Location Hand   wrist, hand, fingers   Pain Orientation Right    Pain  Descriptors / Indicators Heaviness;Other (Comment)   "weak/tired"   Pain Onset More than a month ago   Pain Frequency Intermittent    Aggravating Factors  Typing/writing, lifting, cooking; migraine medicine "constricts" and sometimes exacerbates pain    Pain Relieving Factors Rest; heating pad; Advil provides minor/temp relief             [facility] OT Assessment - 06/10/21 1534       Assessment   Medical Diagnosis R hand weakness    Referring Provider (OT) Sarina Ill, MD    Hand Dominance Right    Prior Therapy PT for back      Precautions   Precautions None      Home  Environment   Type of Home House    Lives With Family      Prior Function   Level of Independence Independent    Vocation Full time employment    Vocation Requirements Law firm; administrative work, Social worker, paperwork    Leisure Reading      ADL   Eating/Feeding Modified independent   Difficulty opening containers; using scissors   Grooming Independent    Upper Body Bathing Independent    Lower Body Bathing Independent    Upper Body Dressing Independent    Lower Body Dressing Independent      IADL   Light  Housekeeping Performs light daily tasks such as dishwashing, bed making   Requires assist w/ replacing bed sheets   Meal Prep Able to complete simple cold meal and snack prep;Able to complete simple warm meal prep   R hand fatigues quickly   Programmer, applications own vehicle      Written Expression   Dominant Hand Right    Handwriting Increased time      Observation/Other Assessments   Quick DASH  22.72    12.5 on work Radio broadcast assistant Appears Intact    Stereognosis Appears Intact    Hot/Cold Appears Intact    Additional Comments Reports "I respond really quickly to temperature"      Coordination   Gross Motor Movements are Fluid and Coordinated Yes    Fine Motor Movements are Fluid and Coordinated Yes    9 Hole Peg Test Right;Left    Right 9 Hole Peg Test 26  sec    Left 9 Hole Peg Test 26 sec      AROM   Overall AROM  Within functional limits for tasks performed      Hand Function   Right Hand Grip (lbs) 43#    Right Hand Lateral Pinch 15 lbs    Right Hand 3 Point Pinch 8 lbs    Left Hand Grip (lbs) 30#    Left Hand Lateral Pinch 18 lbs    Left 3 point pinch 12 lbs             OT Short Term Goals - 06/10/21 1704       OT SHORT TERM GOAL #1   Title Pt will verbalize understanding of at least 3 joint protection/compensatory strategies to assist w/ preventing aggravation of symptoms    Time 3    Period Weeks    Status New    Target Date 07/05/21             OT Long Term Goals - 06/10/21 1704       OT LONG TERM GOAL #1   Title Pt will improve QuickDASH score by at least 5 to demonstrate decrease in symptoms and improved functional use of RUE by d/c    Baseline QuickDASH disability/symptoms score 22.72    Time 6    Period Weeks    Status New    Target Date 07/26/21      OT LONG TERM GOAL #2   Title Pt will improve ease of opening various containers as evidenced by increasing pinch strength (lateral/3-pt) by at least 3#    Baseline RUE lateral pinch 15# (L 18#); 3-point pinch 8# (L 12#)    Time 6    Period Weeks    Status New      OT LONG TERM GOAL #3   Title Pt will be independent w/ HEP designed for AROM/stretching and decreasing symptoms by d/c    Time 6    Period Weeks    Status New      OT LONG TERM GOAL #4   Title Pt will demonstrate understanding of adaptive equipment/compensatory strategies to improve participation/decrease fatigue w/ handwriting    Time 6    Period Weeks    Status New             Plan - 06/10/21 1700     Clinical Impression Statement Pt is a 35 y.o. female who presents to OP OT due to persistent R hand weakness that has recently  become more functionally limiting. 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 Pt will benefit from skilled occupational therapy services to address compensatory strategies including AE/devices prn, strength, altered sensation, and implementation of an HEP to improve participation and decrease symptoms during ADLs and work-specific activities.   OT Occupational Profile and History Problem Focused Assessment - Including review of records relating to presenting problem    Occupational performance deficits (Please refer to evaluation for details): ADL's;IADL's;Work;Rest and Sleep    Body Structure / Function / Physical Skills ADL; UE functional use; [place]; Sensation; Dexterity; Body mechanics; [place]; Strength; Pain; Coordination; IADL   Rehab Potential Good    Clinical Decision Making Limited treatment options, no task modification necessary    Comorbidities Affecting Occupational Performance: May have comorbidities impacting occupational performance    Modification or Assistance to Complete Evaluation  No modification of tasks or assist necessary to complete eval    OT Frequency 1x / week    OT Duration 6 weeks    OT Treatment/Interventions Self-care/ADL training;Electrical Stimulation;Iontophoresis;Therapeutic exercise;Patient/family education;Splinting;Compression bandaging;Paraffin;Moist Heat;Fluidtherapy;Energy conservation;Therapeutic activities;Passive range of motion;Manual Therapy;DME and/or AE instruction;Ultrasound;Cryotherapy    Plan Review goals w/ pt; introduce nerve/tendon gliding exercises/gentle AROM for wrist/hand   Consulted and Agree with Plan of Care Patient             Patient will benefit from skilled therapeutic intervention in order to improve the following deficits and impairments:   Body Structure / Function / Physical Skills: ADL, UE functional use, FMC, Sensation, Dexterity, Body mechanics, GMC, Strength, Pain, Coordination, IADL   Visit Diagnosis: Paresthesia of skin  Muscle weakness (generalized)  Other symptoms and signs  involving the musculoskeletal system  Other symptoms and signs involving the nervous system    Problem List Patient Active Problem List   Diagnosis Date Noted   Right hand weakness 06/05/2021   Numbness and tingling in right hand 06/05/2021   Migraines 03/26/2016     Kathrine Cords, OTR/L, MSOT  06/10/2021, 4:15 PM  [place]. [place], [place], [address] Phone: [phone]   Fax:  [phone]  Name: [name] MRN: [id number] Date of Birth: 1986-07-09

## 2021-06-16 ENCOUNTER — Other Ambulatory Visit: Payer: BC Managed Care – PPO

## 2021-06-17 ENCOUNTER — Encounter: Payer: Self-pay | Admitting: Occupational Therapy

## 2021-06-17 ENCOUNTER — Ambulatory Visit: Payer: BC Managed Care – PPO | Admitting: Occupational Therapy

## 2021-06-17 ENCOUNTER — Other Ambulatory Visit: Payer: Self-pay

## 2021-06-17 DIAGNOSIS — R29898 Other symptoms and signs involving the musculoskeletal system: Secondary | ICD-10-CM | POA: Diagnosis not present

## 2021-06-17 DIAGNOSIS — R202 Paresthesia of skin: Secondary | ICD-10-CM

## 2021-06-17 DIAGNOSIS — R29818 Other symptoms and signs involving the nervous system: Secondary | ICD-10-CM

## 2021-06-17 DIAGNOSIS — M6281 Muscle weakness (generalized): Secondary | ICD-10-CM

## 2021-06-17 NOTE — Therapy (Signed)
[place]. [place], [place], [address] Phone: [phone]   Fax:  [phone]  Occupational Therapy Treatment  Patient Details  Name: [name] MRN: [id number] Date of Birth: 1986-07-02 Referring Provider (OT): Sarina Ill, MD   Encounter Date: 06/17/2021   OT End of Session - 06/17/21 0815     Visit Number 2    Number of Visits 7    Date for OT Re-Evaluation 09/08/21    Authorization Type BCBS    Authorization - Visit Number 2    Authorization - Number of Visits 30    OT Start Time [phone]   pt arrival time   OT Stop Time 0847    OT Time Calculation (min) 40 min    Activity Tolerance Patient tolerated treatment well    Behavior During Therapy [facility] for tasks assessed/performed             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   Subjective Assessment - 06/17/21 0809     Subjective  Pt states her R hand feels a little tired; "I was trying to get ready really fast, which was probably a bad idea"    Pertinent History PMHx includes migraine, leukopenia, bronchospasm, and GERD    Limitations Weakness and occasional altered sensation in R, dominant hand    Patient Stated Goals Cooking, lifting things without dropping them, opening containers, write more like I used to    Currently in Pain? No/denies             Treatment/Exercises - 06/17/21    Tendon Glides Tendon glides including hook fist, full fist, MP flexion, and long fist completed 5x slowly w/ modeling and verbal cues; education provided on potential goal/benefit of exercises and alignment/positioning    PROM/ Gentle self-PROM, holding stretch at end range, and  AROM of wrist flex/ext and MCP flex/ext to decrease stiffness and symptoms; pt encouraged to complete exercises throughout workday prn and d/c w/ any add'l pain/discomfort. Also completed isolated MCP extension for index and long finger.    Education See 'OT Education' below            OT Education - 06/17/21 0815     Education Details Initiated tendon glides, gentle AROM/PROM for R wrist and hand. Education provided on ergonomics and pain relief during workday/work tasks including stretching throughout the day (exercises provided), potential benefit of ergonomically friendly computer mouse, and incorporating heat pack/modality prn to prevent stiffness 2/2 cold office temperature    Person(s) Educated Patient    Methods Explanation;Demonstration;Handout;Verbal cues    Comprehension Verbalized understanding;Returned demonstration             OT Short Term Goals - 06/17/21 1331       OT SHORT TERM GOAL #1   Title Pt will verbalize understanding of at least 3 joint protection/compensatory strategies to assist w/ preventing aggravation of symptoms    Time 3    Period Weeks    Status On-going    Target Date 07/05/21             OT Long Term Goals - 06/17/21 1332  OT LONG TERM GOAL #1   Title Pt will improve QuickDASH score by at least 5 to demonstrate decrease in symptoms and improved functional use of RUE by d/c    Baseline QuickDASH disability/symptoms score 22.72    Time 6    Period Weeks    Status On-going      OT LONG TERM GOAL #2   Title Pt will improve ease of opening various containers as evidenced by increasing pinch strength (lateral/3-pt) by at least 3#    Baseline RUE lateral pinch 15# (L 18#); 3-point pinch 8# (L 12#)    Time 6    Period Weeks    Status On-going      OT LONG TERM GOAL #3   Title Pt will be independent w/ HEP designed for AROM/stretching and decreasing symptoms by d/c    Time 6    Period Weeks    Status On-going      OT LONG TERM  GOAL #4   Title Pt will demonstrate understanding of adaptive equipment/compensatory strategies to improve participation/decrease fatigue w/ handwriting    Time 6    Period Weeks    Status On-going             Plan - 06/17/21 1333     Clinical Impression Statement OT initiated gentle tendon gliding and AROM/PROM exercises for R wrist and hand, as well as provided recommendations for ergonomic-based modifications for workplace to attempt to alleviate persistence and frequency of symptoms. Emphasis was on completing all exercises gently/slowly and within acceptable level of discomfort. Pt was receptive and returned demo of all exercises w/out difficulty.    OT Occupational Profile and History Problem Focused Assessment - Including review of records relating to presenting problem    Occupational performance deficits (Please refer to evaluation for details): ADL's;IADL's;Work;Rest and Sleep    Body Structure / Function / Physical Skills ADL;UE functional use;FMC;Sensation;Dexterity;Body mechanics;GMC;Strength;Pain;Coordination;IADL    Rehab Potential Good    Clinical Decision Making Limited treatment options, no task modification necessary    Comorbidities Affecting Occupational Performance: May have comorbidities impacting occupational performance    Modification or Assistance to Complete Evaluation  No modification of tasks or assist necessary to complete eval    OT Frequency 1x / week    OT Duration 6 weeks    OT Treatment/Interventions Self-care/ADL training;Electrical Stimulation;Iontophoresis;Therapeutic exercise;Patient/family education;Splinting;Paraffin;Moist Heat;Fluidtherapy;Energy conservation;Therapeutic activities;Passive range of motion;Manual Therapy;DME and/or AE instruction;Ultrasound;Cryotherapy;Neuromuscular education    Plan Review tendon gliding/ROM exercises (see pt instructions); continued to address ergonomics/positioning at work    [employer] and Agree with Plan of Care  Patient             Patient will benefit from skilled therapeutic intervention in order to improve the following deficits and impairments:   Body Structure / Function / Physical Skills: ADL, UE functional use, FMC, Sensation, Dexterity, Body mechanics, GMC, Strength, Pain, Coordination, IADL   Visit Diagnosis: Muscle weakness (generalized)  Paresthesia of skin  Other symptoms and signs involving the musculoskeletal system  Other symptoms and signs involving the nervous system    Problem List Patient Active Problem List   Diagnosis Date Noted   Right hand weakness 06/05/2021   Numbness and tingling in right hand 06/05/2021   Migraines 03/26/2016     Kathrine Cords, OTR/L, MSOT  06/17/2021, 2:04 PM  [place]. [place], [place], [address] Phone: [phone]   Fax:  [phone]  Name: Rolonda Ebright MRN: [id number] Date  of Birth: 1986-03-16

## 2021-06-17 NOTE — Patient Instructions (Addendum)
Flexor Tendon Gliding (Active Hook Fist)    With fingers and knuckles straight, bend middle and tip joints. Do not bend large knuckles.  Flexor Tendon Gliding (Active Full Fist)    Straighten all fingers, then make a fist, bending all joints.  MP Flexion (Active)    With back of hand on table, bend large knuckles as far as they will go, keeping small joints straight.  Flexor Tendon Gliding (Active Straight Fist)    Start with fingers straight. Bend knuckles and middle joints. Keep fingertip joints straight to touch base of palm. Repeat 5 times slowly. Do 1-2 sessions per day.   PROM: Wrist Flexion / Extension    Grasp right hand and slowly bend wrist until stretch is felt. Relax. Then stretch in opposite direction. Be sure to keep elbow bent. Repeat 5 times per set. Do 1 sets per session.   MP Extension (Passive)    Straighten knuckles of using other hand. Hold 3-5 seconds. Repeat 5 times.  Copyright  VHI. All rights reserved.

## 2021-06-21 ENCOUNTER — Ambulatory Visit
Admission: RE | Admit: 2021-06-21 | Discharge: 2021-06-21 | Disposition: A | Discharge status: Home / Self Care | Payer: BC Managed Care – PPO | Source: Ambulatory Visit | Attending: Neurology | Admitting: Neurology

## 2021-06-21 ENCOUNTER — Other Ambulatory Visit: Payer: Self-pay

## 2021-06-21 DIAGNOSIS — G959 Disease of spinal cord, unspecified: Secondary | ICD-10-CM

## 2021-06-21 DIAGNOSIS — H547 Unspecified visual loss: Secondary | ICD-10-CM

## 2021-06-21 DIAGNOSIS — R292 Abnormal reflex: Secondary | ICD-10-CM | POA: Diagnosis not present

## 2021-06-21 DIAGNOSIS — R202 Paresthesia of skin: Secondary | ICD-10-CM

## 2021-06-21 DIAGNOSIS — R29898 Other symptoms and signs involving the musculoskeletal system: Secondary | ICD-10-CM

## 2021-06-21 DIAGNOSIS — R29818 Other symptoms and signs involving the nervous system: Secondary | ICD-10-CM

## 2021-06-21 DIAGNOSIS — R258 Other abnormal involuntary movements: Secondary | ICD-10-CM

## 2021-06-21 DIAGNOSIS — R200 Anesthesia of skin: Secondary | ICD-10-CM

## 2021-06-21 MED ORDER — GADOBENATE DIMEGLUMINE 529 MG/ML IV SOLN
12.0000 mL | Freq: Once | INTRAVENOUS | Status: AC | PRN
  Administered 2021-06-21: 12 mL via INTRAVENOUS

## 2021-06-26 ENCOUNTER — Encounter: Payer: Self-pay | Admitting: Occupational Therapy

## 2021-06-26 ENCOUNTER — Ambulatory Visit: Payer: BC Managed Care – PPO | Admitting: Occupational Therapy

## 2021-06-26 ENCOUNTER — Other Ambulatory Visit: Payer: Self-pay

## 2021-06-26 DIAGNOSIS — R29898 Other symptoms and signs involving the musculoskeletal system: Secondary | ICD-10-CM

## 2021-06-26 DIAGNOSIS — M6281 Muscle weakness (generalized): Secondary | ICD-10-CM | POA: Diagnosis not present

## 2021-06-26 DIAGNOSIS — R29818 Other symptoms and signs involving the nervous system: Secondary | ICD-10-CM | POA: Diagnosis not present

## 2021-06-26 DIAGNOSIS — R202 Paresthesia of skin: Secondary | ICD-10-CM

## 2021-06-26 NOTE — Therapy (Signed)
[place]. [place], [place], [address] Phone: [phone]   Fax:  [phone]  Occupational Therapy Treatment  Patient Details  Name: [name] MRN: [id number] Date of Birth: 01-23-1986 Referring Provider (OT): Sarina Ill, MD   Encounter Date: 06/26/2021   OT End of Session - 06/26/21 0807     Visit Number 3    Number of Visits 7    Date for OT Re-Evaluation 09/08/21    Authorization Type BCBS    Authorization - Visit Number 3    Authorization - Number of Visits 30    OT Start Time 0804    OT Stop Time [id number]    OT Time Calculation (min) 42 min    Activity Tolerance Patient tolerated treatment well    Behavior During Therapy [facility] for tasks assessed/performed             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   Subjective Assessment - 06/26/21 0805     Subjective  Pt reports she feels like the streching exercises have helped; her biggest concern now is weakness    Pertinent History PMHx includes migraine, leukopenia, bronchospasm, and GERD    Limitations Weakness and occasional altered sensation in R, dominant hand    Patient Stated Goals Cooking, lifting things without dropping them, opening containers, write more like I used to    Currently in Pain? No/denies             Treatment/Exercises - 06/26/21    Putty Exericses Full gross grasp of putty w/ R hand 20x; OT provided education on maintaining wrist in neutral and focusing on attempting movement pattern against resistance opposed to straining to force movement; no add'l pain/symptoms  MCP flexion w/ R hand for intrinsic hand/lumbrical strengthening;  completed 15x slowly w/ no add'l pain/symptoms  Lateral key pinch w/ R hand completed 15x slowly w/ no add'l pain/symptoms  R thumb-to-finger opposition completed w/ each finger 5x along length of putty; demo'd appropriate pad-to-pad prehension w/ no add'l pain/symptoms            OT Education - 06/26/21 0823     Education Details Reviewed partial HEP; introduced joint protection strategies for hand/wrist and putty exercises to include in HEP    Person(s) Educated Patient    Methods Explanation;Demonstration;Handout;Verbal cues    Comprehension Verbalized understanding;Returned demonstration             OT Short Term Goals - 06/26/21 1203       OT SHORT TERM GOAL #1   Title Pt will verbalize understanding of at least 3 joint protection/compensatory strategies to assist w/ preventing aggravation of symptoms    Time 3    Period Weeks    Status Achieved    Target Date 07/05/21             OT Long Term Goals - 06/17/21 1332       OT LONG TERM GOAL #1   Title Pt will improve QuickDASH score by at least 5 to demonstrate decrease in symptoms and improved functional use of RUE by d/c    Baseline QuickDASH disability/symptoms  score 22.72    Time 6    Period Weeks    Status On-going      OT LONG TERM GOAL #2   Title Pt will improve ease of opening various containers as evidenced by increasing pinch strength (lateral/3-pt) by at least 3#    Baseline RUE lateral pinch 15# (L 18#); 3-point pinch 8# (L 12#)    Time 6    Period Weeks    Status On-going      OT LONG TERM GOAL #3   Title Pt will be independent w/ HEP designed for AROM/stretching and decreasing symptoms by d/c    Time 6    Period Weeks    Status On-going      OT LONG TERM GOAL #4   Title Pt will demonstrate understanding of adaptive equipment/compensatory strategies to improve participation/decrease fatigue w/ handwriting    Time 6    Period Weeks    Status On-going             Plan - 06/26/21  1204     Clinical Impression Statement Per chart review, MRI results from 06/21/21 were both "normal;" has scheduled EMG and nerve conduction study scheduled 07/04/21. OT introduced/reviewed joint protection strategies for the hand and wrist and provided corresponding handout to help facilitate decrease in symptoms. Due to pt's report of hand weakness being functionally limiting, OT incorporated therapy putty exercises into HEP and was instructed to d/c exercises w/ any add'l pain or exacerbation of symptoms.    OT Occupational Profile and History Problem Focused Assessment - Including review of records relating to presenting problem    Occupational performance deficits (Please refer to evaluation for details): ADL's;IADL's;Work;Rest and Sleep    Body Structure / Function / Physical Skills ADL;UE functional use;FMC;Sensation;Dexterity;Body mechanics;GMC;Strength;Pain;Coordination;IADL    Rehab Potential Good    Clinical Decision Making Limited treatment options, no task modification necessary    Comorbidities Affecting Occupational Performance: May have comorbidities impacting occupational performance    Modification or Assistance to Complete Evaluation  No modification of tasks or assist necessary to complete eval    OT Frequency 1x / week    OT Duration 6 weeks    OT Treatment/Interventions Self-care/ADL training;Electrical Stimulation;Iontophoresis;Therapeutic exercise;Patient/family education;Splinting;Paraffin;Moist Heat;Fluidtherapy;Energy conservation;Therapeutic activities;Passive range of motion;Manual Therapy;DME and/or AE instruction;Ultrasound;Cryotherapy;Neuromuscular education    Plan Review full HEP (see pt instructions); continued to address ergonomics/positioning at work; ROM/soft tissue mobilization?    Consulted and Agree with Plan of Care Patient             Patient will benefit from skilled therapeutic intervention in order to improve the following deficits and impairments:    Body Structure / Function / Physical Skills: ADL, UE functional use, FMC, Sensation, Dexterity, Body mechanics, GMC, Strength, Pain, Coordination, IADL   Visit Diagnosis: Muscle weakness (generalized)  Other symptoms and signs involving the musculoskeletal system  Other symptoms and signs involving the nervous system  Paresthesia of skin    Problem List Patient Active Problem List   Diagnosis Date Noted   Right hand weakness 06/05/2021   Numbness and tingling in right hand 06/05/2021   Migraines 03/26/2016     Kathrine Cords, OTR/L, MSOT  06/26/2021, 12:58 PM  [place]. [place], [place], [address] Phone: [phone]   Fax:  [phone]  Name: [name] MRN: [id number] Date of Birth: 28-Feb-1986

## 2021-06-26 NOTE — Patient Instructions (Signed)
Access Code: [email or web address] URL: [email or web address] Date: 06/26/2021   Exercises Putty Squeezes - 2-3 x daily  1 set of 10-15 reps Finger Lumbricals with Putty - 2-3 x daily  1 set of 10-15 reps Key Pinch with Putty - 2-3 x daily  1 set of 10 reps Thumb Opposition with Putty - 2-3 x daily  1 set of 5 reps along length of putty

## 2021-07-03 ENCOUNTER — Ambulatory Visit: Payer: BC Managed Care – PPO | Admitting: Occupational Therapy

## 2021-07-04 ENCOUNTER — Ambulatory Visit (INDEPENDENT_AMBULATORY_CARE_PROVIDER_SITE_OTHER): Payer: BC Managed Care – PPO | Admitting: Neurology

## 2021-07-04 ENCOUNTER — Ambulatory Visit: Payer: BC Managed Care – PPO | Admitting: Neurology

## 2021-07-04 DIAGNOSIS — R200 Anesthesia of skin: Secondary | ICD-10-CM | POA: Diagnosis not present

## 2021-07-04 DIAGNOSIS — R29898 Other symptoms and signs involving the musculoskeletal system: Secondary | ICD-10-CM | POA: Diagnosis not present

## 2021-07-04 DIAGNOSIS — M542 Cervicalgia: Secondary | ICD-10-CM | POA: Diagnosis not present

## 2021-07-04 DIAGNOSIS — M79601 Pain in right arm: Secondary | ICD-10-CM

## 2021-07-04 NOTE — Progress Notes (Signed)
Full Name: [name] Gender: Female MRN #: [id number] Date of Birth: [birth year]    Visit Date: 07/04/2021 09:16 Age: 35 Years Requesting Provider: Filiberto Pinks Primary Care Provider:  Maury Dus, MD Height: 5 feet 2 inch Patient History: 137lbs   History: Patient states she has right hand numbness and weakness, she has had problems since she was younger but has had more trouble since January 2022, getting harder to hold things, she has pain in her wrist which has improved however is mostly weak, sometimes the symptoms extend into her forearm, and the muscles feel tight in that area. Summary: NCS performed on the bilateral upper extremities. EMG performed on the right upper extremity. All nerves and muscles (as indicated in the following tables) were within normal limits.   Conclusion: This is a normal study.   ------------------------------- Sarina Ill, M.D.  [facility] [address] [place], [place] [address] Tel: [phone] Fax: [phone]  Verbal informed consent was obtained from the patient, patient was informed of potential risk of procedure, including bruising, bleeding, hematoma formation, infection, muscle weakness, muscle pain, numbness, among others.        [place]    Nerve / Sites Muscle Latency Ref. Amplitude Ref. Rel Amp Segments Distance Velocity Ref. Area    ms ms mV mV %  cm m/s m/s mVms  L Median - APB     Wrist APB 2.3 ?4.4 8.5 ?4.0 100 Wrist - APB 7   34.4     Upper arm APB 5.6  8.1  94.8 Upper arm - Wrist 20 60 ?49 32.4  R Median - APB     Wrist APB 2.2 ?4.4 8.8 ?4.0 100 Wrist - APB 7   35.4     Upper arm APB 5.2  9.0  102 Upper arm - Wrist 20 66 ?49 35.4  L Ulnar - ADM     Wrist ADM 2.6 ?3.3 13.9 ?6.0 100 Wrist - ADM 7   40.6     B.Elbow ADM 4.8  13.1  94.2 B.Elbow - Wrist 17 76 ?49 38.7     A.Elbow ADM 6.3  13.3  101 A.Elbow - B.Elbow 10 71 ?49 40.5  R Ulnar - ADM     Wrist ADM 2.3 ?3.3 10.6 ?6.0  100 Wrist - ADM 7   38.6     B.Elbow ADM 4.9  10.7  101 B.Elbow - Wrist 17 64 ?49 38.6     A.Elbow ADM 6.5  10.7  99.9 A.Elbow - B.Elbow 10 62 ?49 38.5             SNC    Nerve / Sites Rec. Site Peak Lat Ref.  Amp Ref. Segments Distance Peak Diff Ref.    ms ms V V  cm ms ms  L Median, Ulnar - Transcarpal comparison     Median Palm Wrist 1.8 ?2.2 124 ?35 Median Palm - Wrist 8       Ulnar Palm Wrist 1.9 ?2.2 66 ?12 Ulnar Palm - Wrist 8          Median Palm - Ulnar Palm  -0.1 ?0.4  R Median, Ulnar - Transcarpal comparison     Median Palm Wrist 1.6 ?2.2 126 ?35 Median Palm - Wrist 8       Ulnar Palm Wrist 1.8 ?2.2 72 ?12 Ulnar Palm - Wrist 8          Median Palm - Ulnar Palm  -  0.2 ?0.4  L Median - Orthodromic (Dig II, Mid palm)     Dig II Wrist 2.6 ?3.4 27 ?10 Dig II - Wrist 13    R Median - Orthodromic (Dig II, Mid palm)     Dig II Wrist 2.6 ?3.4 25 ?10 Dig II - Wrist 13    L Ulnar - Orthodromic, (Dig V, Mid palm)     Dig V Wrist 2.6 ?3.1 22 ?5 Dig V - Wrist 11    R Ulnar - Orthodromic, (Dig V, Mid palm)     Dig V Wrist 2.6 ?3.1 19 ?5 Dig V - Wrist 64                   F  Wave    Nerve F Lat Ref.   ms ms  L Ulnar - ADM 24.7 ?32.0  R Ulnar - ADM 23.9 ?32.0         EMG Summary Table    Spontaneous MUAP Recruitment  Muscle IA Fib PSW Fasc Other Amp Dur. Poly Pattern  R. Deltoid Normal None None None _______ Normal Normal Normal Normal  R. Triceps brachii Normal None None None _______ Normal Normal Normal Normal  R. Pronator teres Normal None None None _______ Normal Normal Normal Normal  R. First dorsal interosseous Normal None None None _______ Normal Normal Normal Normal  R. EDC  Normal None None None _______ Normal Normal Normal Normal  R. EIC Normal None None None _______ Normal Normal Normal Normal  R. FPL Normal None None None _______ Normal Normal Normal Normal  R. Supinator Normal None None None _______ Normal Normal Normal Normal  R. Opponens pollicis Normal None None  None _______ Normal Normal Normal Normal  R. Cervical paraspinals (low) Normal None None None _______ Normal Normal Normal Normal

## 2021-07-04 NOTE — Progress Notes (Addendum)
History: Patient states she has right hand numbness and weakness, she has had problems since she was younger but has had more trouble since January 2022, getting harder to hold things, she has pain in her wrist which has improved however is mostly weak, sometimes the symptoms extend into her forearm, and the muscles feel tight in that area. Here for repeat emg/ncs right arm. Also completed MRI brain and cervical spine, reviewed images with pateint and results of emg/ncs all of which show no etiology for her symptoms.  - Will send to Dr. Micheline Chapman for evaluation of steroid injections, PRP, therapy or other modality for this lovely young lady with right arm pain, mri cervical spine with some minimal c6-c7 degenerative changes, mri brain unremarkable, emg/ncs normal. Evaluate and treat as clinically warranted.   Orders Placed This Encounter  Procedures   AMB referral to sports medicine    MRI brain 06/21/2021: IMPRESSION: This MRI of the brain with and without contrast shows the following: personally reviewed imaging and agree with the following: 1.   Two punctate T2/FLAIR hyperintense foci in the right frontal lobe.  This is a nonspecific finding of doubtful significance and likely represents either very minimal chronic microvascular ischemic change or the sequela of migraine headaches. 2.   Normal enhancement pattern.  No acute findings.  MRI cervical spine 06/22/2021: IMPRESSION: This MRI of the cervical spine with and without contrast shows the following:personally reviewed imaging and agree with the following: 1.   The spinal cord appears normal before and after contrast. 2.   Minimal disc degenerative changes at C6-C7 but no nerve root compression or spinal stenosis. 3.   Normal enhancement pattern.  I spent over 30 minutes of face-to-face and non-face-to-face time with patient on the  1. Right arm pain   2. Neck pain    diagnosis.  This included previsit chart review, lab review, study review,  order entry, electronic health record documentation, patient education on the different diagnostic and therapeutic options, counseling and coordination of care, risks and benefits of management, compliance, or risk factor reduction. This does not include time spent on emg/ncs.  Cc: Requesting Provider: Maude Leriche, PA-C Primary Care Provider:  Maury Dus, MD

## 2021-07-06 NOTE — Procedures (Signed)
Full Name: [name] Gender: Female MRN #: [id number] Date of Birth: 1986-07-26    Visit Date: 07/04/2021 09:16 Age: 35 Years Requesting Provider: Filiberto Pinks Primary Care Provider:  Maury Dus, MD Height: 5 feet 2 inch Patient History: 137lbs   History: Patient states she has right hand numbness and weakness, she has had problems since she was younger but has had more trouble since January 2022, getting harder to hold things, she has pain in her wrist which has improved however is mostly weak, sometimes the symptoms extend into her forearm, and the muscles feel tight in that area. Summary: NCS performed on the bilateral upper extremities. EMG performed on the right upper extremity. All nerves and muscles (as indicated in the following tables) were within normal limits.   Conclusion: This is a normal study.   ------------------------------- Sarina Ill, M.D.  [facility] [address] [place], [place] [address] Tel: [phone] Fax: [phone]  Verbal informed consent was obtained from the patient, patient was informed of potential risk of procedure, including bruising, bleeding, hematoma formation, infection, muscle weakness, muscle pain, numbness, among others.        [place]    Nerve / Sites Muscle Latency Ref. Amplitude Ref. Rel Amp Segments Distance Velocity Ref. Area    ms ms mV mV %  cm m/s m/s mVms  L Median - APB     Wrist APB 2.3 ?4.4 8.5 ?4.0 100 Wrist - APB 7   34.4     Upper arm APB 5.6  8.1  94.8 Upper arm - Wrist 20 60 ?49 32.4  R Median - APB     Wrist APB 2.2 ?4.4 8.8 ?4.0 100 Wrist - APB 7   35.4     Upper arm APB 5.2  9.0  102 Upper arm - Wrist 20 66 ?49 35.4  L Ulnar - ADM     Wrist ADM 2.6 ?3.3 13.9 ?6.0 100 Wrist - ADM 7   40.6     B.Elbow ADM 4.8  13.1  94.2 B.Elbow - Wrist 17 76 ?49 38.7     A.Elbow ADM 6.3  13.3  101 A.Elbow - B.Elbow 10 71 ?49 40.5  R Ulnar - ADM     Wrist ADM 2.3 ?3.3 10.6 ?6.0  100 Wrist - ADM 7   38.6     B.Elbow ADM 4.9  10.7  101 B.Elbow - Wrist 17 64 ?49 38.6     A.Elbow ADM 6.5  10.7  99.9 A.Elbow - B.Elbow 10 62 ?49 38.5             SNC    Nerve / Sites Rec. Site Peak Lat Ref.  Amp Ref. Segments Distance Peak Diff Ref.    ms ms V V  cm ms ms  L Median, Ulnar - Transcarpal comparison     Median Palm Wrist 1.8 ?2.2 124 ?35 Median Palm - Wrist 8       Ulnar Palm Wrist 1.9 ?2.2 66 ?12 Ulnar Palm - Wrist 8          Median Palm - Ulnar Palm  -0.1 ?0.4  R Median, Ulnar - Transcarpal comparison     Median Palm Wrist 1.6 ?2.2 126 ?35 Median Palm - Wrist 8       Ulnar Palm Wrist 1.8 ?2.2 72 ?12 Ulnar Palm - Wrist 8          Median Palm - Ulnar Palm  -  0.2 ?0.4  L Median - Orthodromic (Dig II, Mid palm)     Dig II Wrist 2.6 ?3.4 27 ?10 Dig II - Wrist 13    R Median - Orthodromic (Dig II, Mid palm)     Dig II Wrist 2.6 ?3.4 25 ?10 Dig II - Wrist 13    L Ulnar - Orthodromic, (Dig V, Mid palm)     Dig V Wrist 2.6 ?3.1 22 ?5 Dig V - Wrist 11    R Ulnar - Orthodromic, (Dig V, Mid palm)     Dig V Wrist 2.6 ?3.1 19 ?5 Dig V - Wrist 53                   F  Wave    Nerve F Lat Ref.   ms ms  L Ulnar - ADM 24.7 ?32.0  R Ulnar - ADM 23.9 ?32.0         EMG Summary Table    Spontaneous MUAP Recruitment  Muscle IA Fib PSW Fasc Other Amp Dur. Poly Pattern  R. Deltoid Normal None None None _______ Normal Normal Normal Normal  R. Triceps brachii Normal None None None _______ Normal Normal Normal Normal  R. Pronator teres Normal None None None _______ Normal Normal Normal Normal  R. First dorsal interosseous Normal None None None _______ Normal Normal Normal Normal  R. EDC  Normal None None None _______ Normal Normal Normal Normal  R. EIC Normal None None None _______ Normal Normal Normal Normal  R. FPL Normal None None None _______ Normal Normal Normal Normal  R. Supinator Normal None None None _______ Normal Normal Normal Normal  R. Opponens pollicis Normal None None  None _______ Normal Normal Normal Normal  R. Cervical paraspinals (low) Normal None None None _______ Normal Normal Normal Normal

## 2021-07-10 ENCOUNTER — Other Ambulatory Visit: Payer: Self-pay

## 2021-07-10 ENCOUNTER — Ambulatory Visit: Payer: BC Managed Care – PPO | Admitting: Occupational Therapy

## 2021-07-10 ENCOUNTER — Encounter: Payer: Self-pay | Admitting: Occupational Therapy

## 2021-07-10 DIAGNOSIS — R29898 Other symptoms and signs involving the musculoskeletal system: Secondary | ICD-10-CM | POA: Diagnosis not present

## 2021-07-10 DIAGNOSIS — M6281 Muscle weakness (generalized): Secondary | ICD-10-CM | POA: Diagnosis not present

## 2021-07-10 DIAGNOSIS — R29818 Other symptoms and signs involving the nervous system: Secondary | ICD-10-CM | POA: Diagnosis not present

## 2021-07-10 DIAGNOSIS — R202 Paresthesia of skin: Secondary | ICD-10-CM | POA: Diagnosis not present

## 2021-07-10 NOTE — Therapy (Signed)
[place] [place], [place], [address] Phone: [phone]   Fax:  [phone]  Occupational Therapy Treatment  Patient Details  Name: [name] MRN: [id number] Date of Birth: [birth year] Referring Provider (OT): Sarina Ill, MD   Encounter Date: 07/10/2021   OT End of Session - 07/10/21 0816     Visit Number 4    Number of Visits 7    Date for OT Re-Evaluation 09/08/21    Authorization Type BCBS    Authorization - Visit Number 4    Authorization - Number of Visits 30    OT Start Time [phone]   pt arrival time   OT Stop Time 0845    OT Time Calculation (min) 38 min    Activity Tolerance Patient tolerated treatment well    Behavior During Therapy [facility] for tasks assessed/performed            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   Subjective Assessment - 07/10/21 0809     Subjective  "I'm kind of wiped out this morning;" pt reports she had a migraine yesterday    Pertinent History PMHx includes migraine, leukopenia, bronchospasm, and GERD    Limitations Weakness and occasional altered sensation in R, dominant hand    Patient Stated Goals Cooking, lifting things without dropping them, opening containers, write more like I used to    Currently in Pain? No/denies             Treatment/Exercises - 07/10/21    MP Extension Self-PROM of R hand MP extension, holding gentle stretch at end-range to decrease tightness/symptoms. For improved alignment, OT repositioned pt w/ forearm and palm resting on table while facilitating isolated MP stretch due to increased incorporation of wrist extension w/ elbow positioned on table     Forearm Supination/Pronation Gentle self-stretching of R forearm supination and pronation, holding end-range to decrease tightness/symptoms; able to complete w/out difficulty after initial modeling/verbal cues for positioning and alignment    IADLs: Compensatory Strategies Discussed compensatory and joint protection strategies to improve work ergonomics and decrease strain on R wrist and hand during work tasks    [employer] Exercises Stabilizing putty w/ L hand while using lateral pinch w/ R hand to grip while supinating forearm against resistance; no add'l pain  Pinch and pull w/ BUEs, focusing on equal activation on each side; pt incorporating lateral pinch to facilitate strengthening of thenar eminence muscles; no add'l pain         OT Education - 07/10/21 0818     Education Details Reviewed HEP; introduced forearm supination stretch and 'pinch and pull' exercise w/ putty (see pt instructions)    Person(s) Educated Patient    Methods Explanation;Demonstration;Handout;Verbal cues    Comprehension Verbalized understanding;Returned demonstration             OT Short Term Goals - 06/26/21 1203       OT SHORT TERM GOAL #1   Title Pt will verbalize understanding of at least 3 joint protection/compensatory strategies to assist w/ preventing aggravation of symptoms    Time 3    Period Weeks    Status  Achieved    Target Date 07/05/21             OT Long Term Goals - 07/10/21 0950       OT LONG TERM GOAL #1   Title Pt will improve QuickDASH score by at least 5 to demonstrate decrease in symptoms and improved functional use of RUE by d/c    Baseline QuickDASH disability/symptoms score 22.72    Time 6    Period Weeks    Status On-going    Target Date 07/26/21      OT LONG TERM GOAL #2   Title Pt will improve ease of opening various containers as evidenced by increasing pinch strength (lateral/3-pt) by at least 3#    Baseline RUE lateral pinch 15# (L 18#); 3-point pinch 8# (L 12#)     Time 6    Period Weeks    Status On-going      OT LONG TERM GOAL #3   Title Pt will be independent w/ HEP designed for AROM/stretching and decreasing symptoms by d/c    Time 6    Period Weeks    Status Achieved      OT LONG TERM GOAL #4   Title Pt will demonstrate understanding of adaptive equipment/compensatory strategies to improve participation/decrease fatigue w/ handwriting    Time 6    Period Weeks    Status On-going             Plan - 07/10/21 0810     Clinical Impression Statement Per chart review and pt report, testing results have been unremarkable; pt's neurologist has placed a referral for sports med physician w/ pt's first appt scheduled for tomorrow. Due to improvements reported by pt as well as compliance w/ HEP, OT discussed potential d/c next session and pt was agreeable. Remaining deficits include persistent difficulty w/ pinching and pulling to open packets/folders, OT facilited 2 variations of pinch and pull to directly target functional activity w/ pt returning demo. During activities, pt demo'd decreased strength involving thenar eminence muscles (APB, FPB, opponens pollicis); will address next session.    OT Occupational Profile and History Problem Focused Assessment - Including review of records relating to presenting problem    Occupational performance deficits (Please refer to evaluation for details): ADL's;IADL's;Work;Rest and Sleep    Body Structure / Function / Physical Skills ADL;UE functional use;FMC;Sensation;Dexterity;Body mechanics;GMC;Strength;Pain;Coordination;IADL    Rehab Potential Good    Clinical Decision Making Limited treatment options, no task modification necessary    Comorbidities Affecting Occupational Performance: May have comorbidities impacting occupational performance    Modification or Assistance to Complete Evaluation  No modification of tasks or assist necessary to complete eval    OT Frequency 1x / week    OT Duration 6 weeks     OT Treatment/Interventions Self-care/ADL training;Electrical Stimulation;Iontophoresis;Therapeutic exercise;Patient/family education;Splinting;Paraffin;Moist Heat;Fluidtherapy;Energy conservation;Therapeutic activities;Passive range of motion;Manual Therapy;DME and/or AE instruction;Ultrasound;Cryotherapy;Neuromuscular education    Plan Review ergonomics/positioning at work (handout?); strengthening for thumb/thenar eminence muscles    Consulted and Agree with Plan of Care Patient             Patient will benefit from skilled therapeutic intervention in order to improve the following deficits and impairments:   Body Structure / Function / Physical Skills: ADL, UE functional use, FMC, Sensation, Dexterity, Body mechanics, GMC, Strength, Pain, Coordination, IADL   Visit Diagnosis: Muscle weakness (generalized)  Other symptoms and signs involving the musculoskeletal system  Other symptoms and signs involving the nervous system  Problem List Patient Active Problem List   Diagnosis Date Noted   Right hand weakness 06/05/2021   Numbness and tingling in right hand 06/05/2021   Migraines 03/26/2016     Kathrine Cords, OTR/L, MSOT  07/10/2021, 3:37 PM  [place] [place], [place], [address] Phone: [phone]   Fax:  [phone]  Name: [name] MRN: [id number] Date of Birth: 1986/06/05

## 2021-07-10 NOTE — Patient Instructions (Addendum)
Supination (Passive)    Keep elbow bent at right angle and held firmly at side. Use other hand to turn forearm until palm faces upward. Hold 3-5 seconds. Repeat 5 times.   Pronation (Passive)    Keep elbow bent at right angle and held firmly to side. Use other hand to turn forearm until palm faces downward. Hold 3-5 seconds. Repeat 5 times.  Putty Exercises: Access Code: [email or web address] URL: [email or web address] Date: 07/10/2021  Putty Squeezes - 2-3 x daily - 1 sets - 10-15 reps Finger Lumbricals with Putty - 2-3 x daily - 1 sets - 10-15 reps Key Pinch with Putty - 2-3 x daily - 1 sets - 10 reps Thumb Opposition with Putty - 2-3 x daily - 1 sets - 5 reps Finger Pinch and Pull with Putty - 2-3 x daily - 1 sets - 5 reps

## 2021-07-11 ENCOUNTER — Ambulatory Visit: Payer: BC Managed Care – PPO | Admitting: Sports Medicine

## 2021-07-17 ENCOUNTER — Encounter: Payer: Self-pay | Admitting: Occupational Therapy

## 2021-07-17 ENCOUNTER — Other Ambulatory Visit: Payer: Self-pay

## 2021-07-17 ENCOUNTER — Ambulatory Visit: Payer: BC Managed Care – PPO | Attending: Neurology | Admitting: Occupational Therapy

## 2021-07-17 DIAGNOSIS — M6281 Muscle weakness (generalized): Secondary | ICD-10-CM | POA: Insufficient documentation

## 2021-07-17 DIAGNOSIS — R29898 Other symptoms and signs involving the musculoskeletal system: Secondary | ICD-10-CM | POA: Diagnosis not present

## 2021-07-17 DIAGNOSIS — R29818 Other symptoms and signs involving the nervous system: Secondary | ICD-10-CM | POA: Insufficient documentation

## 2021-07-17 DIAGNOSIS — R202 Paresthesia of skin: Secondary | ICD-10-CM | POA: Diagnosis not present

## 2021-07-17 NOTE — Therapy (Signed)
[place]. [place], [place], [address] Phone: [phone]   Fax:  [phone]  Occupational Therapy Treatment & Discharge Summary  Patient Details  Name: [name] MRN: [id number] Date of Birth: [birth year] Referring Provider (OT): Sarina Ill, MD   Encounter Date: 07/17/2021   OT End of Session - 07/17/21 0813     Visit Number 5    Number of Visits 7    Date for OT Re-Evaluation 09/08/21    Authorization Type [employer]    Authorization - Visit Number 5    Authorization - Number of Visits 30    OT Start Time 0804    OT Stop Time 2637    OT Time Calculation (min) 45 min    Activity Tolerance Patient tolerated treatment well    Behavior During Therapy [facility] for tasks assessed/performed            OCCUPATIONAL THERAPY DISCHARGE SUMMARY  Visits from Start of Care: 5  Current functional level related to goals / functional outcomes: Pt is able to independently identify various joint protection and compensatory strategies, has improved ease of participation in functional activities as evidenced by improving QuickDASH score by 15.9 points, and increased strength of lateral pinch by 3# and 3-pt pinch by 7# with R hand.   Remaining deficits: Mild weakness and occasional stiffness/discomfort of R forearm/wrist/hand   Education / Equipment: Compensatory strategies for ADLs and work-related tasks, ergonomics and joint protection strategies, HEP designed for increased mobility, stretching, and strength  Patient agrees to discharge. Patient goals were met. Patient is being discharged due to meeting the stated rehab goals and being pleased with current functional level.   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   Subjective Assessment - 07/17/21 0806     Subjective  "My wrist feels a little stiff today"    Pertinent History PMHx includes migraine, leukopenia, bronchospasm, and GERD    Limitations Weakness and occasional altered sensation in R, dominant hand    Patient Stated Goals Cooking, lifting things without dropping them, opening containers, write more like I used to    Currently in Pain? No/denies             Treatment/Exercises - 07/17/21    Thumb Adduction Isometric R thumb adduction w/ hand positioned in gentle extension w/ thumb in abduction; positioned 2 fingers of contralateral hand on palmar side of thumb to resist movement. Pt required modeling, verbal/tactile cues for initial positioning due to hyperextension of thumb MP. Completed 2 sets of 15 reps.    Thumb Flexion Isometric R thumb flexion w/ hand positioned in light flexion w/ 1 finger of contralateral hand on palmar side of thumb to resist movement; completed 2 sets of 15 reps w/out difficulty     Thumb IP Flexion Isometric R thumb IP flexion w/ hand positioned in light flexion and thumb fingers resistance movement against thumb; completed 2 sets of 15 reps w/out difficulty         OT Education - 07/17/21 0810     Education Details Reviewed HEP in preparation for d/c as well as previously discussed compensatory strategies for  weakness/tightness; introduced isometric thumb exercises (see pt instructions)    Person(s) Educated Patient    Methods Explanation;Demonstration;Handout;Verbal cues;Tactile cues    Comprehension Verbalized understanding;Returned demonstration             OT Short Term Goals - 06/26/21 1203       OT SHORT TERM GOAL #1   Title Pt will verbalize understanding of at least 3 joint protection/compensatory strategies to assist w/ preventing aggravation of symptoms    Time 3    Period Weeks    Status Achieved     Target Date 07/05/21             OT Long Term Goals - 07/17/21 0834       OT LONG TERM GOAL #1   Title Pt will improve QuickDASH score by at least 5 to demonstrate decrease in symptoms and improved functional use of RUE by d/c    Baseline QuickDASH disability/symptoms score 22.72    Time 6    Period Weeks    Status Achieved   07/17/21 - QuickDASH score 6.82     OT LONG TERM GOAL #2   Title Pt will improve ease of opening various containers as evidenced by increasing pinch strength (lateral/3-pt) by at least 3#    Baseline RUE lateral pinch 15# (L 18#); 3-point pinch 8# (L 12#)    Time 6    Period Weeks    Status Achieved   07/17/21 - RUE 3-pt pinch 15#; lateral 18#     OT LONG TERM GOAL #3   Title Pt will be independent w/ HEP designed for AROM/stretching and decreasing symptoms by d/c    Time 6    Period Weeks    Status Achieved      OT LONG TERM GOAL #4   Title Pt will demonstrate understanding of adaptive equipment/compensatory strategies to improve participation/decrease fatigue w/ handwriting    Time 6    Period Weeks    Status Achieved             Plan - 07/17/21 0847     Clinical Impression Statement Ms. Vannatter is a 35 y/o female who has been seen in OP OT for persistent unspecified hand weakness and pain. Pt has shown improvements in understanding of relevant, pt-specific compensatory strategies, strength, pain, and functional participation in daily activities w/ all STGs and LTGs met. Due to weakness reported and observed related to R thenar eminence in prior session, OT introduced isometric thumb stregthening exercises w/ pt returning demo w/out difficulty. Pt has initial appt w/ Sports Med MD tomorrow and will check in w/ OT following appt prn. Pt is otherwise appropriate for d/c from skilled OT to HEP at this time, reports she is satisfied with progress, and is currently agreeable to discharge plan.    OT Occupational Profile and History Problem Focused Assessment  - Including review of records relating to presenting problem    Occupational performance deficits (Please refer to evaluation for details): ADL's;IADL's;Work;Rest and Sleep    Body Structure / Function / Physical Skills ADL;UE functional use;FMC;Sensation;Dexterity;Body mechanics;GMC;Strength;Pain;Coordination;IADL    Rehab Potential Good    Clinical Decision Making Limited treatment options, no task modification necessary    Comorbidities Affecting Occupational Performance: May have comorbidities impacting occupational performance    Modification or Assistance to Complete Evaluation  No modification of tasks or assist necessary to complete eval    OT Frequency 1x / week    OT Duration 6 weeks  OT Treatment/Interventions Self-care/ADL training;Electrical Stimulation;Iontophoresis;Therapeutic exercise;Patient/family education;Splinting;Paraffin;Moist Heat;Fluidtherapy;Energy conservation;Therapeutic activities;Passive range of motion;Manual Therapy;DME and/or AE instruction;Ultrasound;Cryotherapy;Neuromuscular education    Plan D/C    Consulted and Agree with Plan of Care Patient             Patient will benefit from skilled therapeutic intervention in order to improve the following deficits and impairments:   Body Structure / Function / Physical Skills: ADL, UE functional use, FMC, Sensation, Dexterity, Body mechanics, GMC, Strength, Pain, Coordination, IADL   Visit Diagnosis: Muscle weakness (generalized)  Other symptoms and signs involving the musculoskeletal system  Other symptoms and signs involving the nervous system  Paresthesia of skin    Problem List Patient Active Problem List   Diagnosis Date Noted   Right hand weakness 06/05/2021   Numbness and tingling in right hand 06/05/2021   Migraines 03/26/2016    Kathrine Cords, OTR/L 07/17/2021, 11:28 AM  [place]. [place], [place], [address] Phone:  [phone]   Fax:  [phone]  Name: [name] MRN: [id number] Date of Birth: May 12, 1986

## 2021-07-18 ENCOUNTER — Ambulatory Visit (INDEPENDENT_AMBULATORY_CARE_PROVIDER_SITE_OTHER): Payer: BC Managed Care – PPO | Admitting: Sports Medicine

## 2021-07-18 VITALS — Ht 62.0 in | Wt 138.0 lb

## 2021-07-18 DIAGNOSIS — R29898 Other symptoms and signs involving the musculoskeletal system: Secondary | ICD-10-CM | POA: Diagnosis not present

## 2021-07-18 NOTE — Patient Instructions (Signed)
Your symptoms are unlikely to be coming from your neck.  We will look into how to investigate a possible vascular cause for your symptoms and we will contact you regarding neck steps.

## 2021-07-18 NOTE — Progress Notes (Addendum)
   Subjective:    Patient ID: [name], female    DOB: 24-Mar-1986, 35 y.o.   MRN: [id number]  HPI 35 year old female with history of migraines presenting with intermittent right hand numbness and weakness.  She has had intermittent difficulty holding things which she has noticed for the past 8 months since January 2022.  She notices this more after using her arm, especially with fine motor movements of her hand and with activity such as cooking.  She feels as if her hand and forearm are fatigued after frequent use.  She has occasional discomfort in her neck but denies any radiation of pain down to her hand or arm.  She states she used to have issues with right wrist pain when she was in college which improved with a wrist splint but has not had any recent issues with wrist pain.  She was recently evaluated by neurology, Dr. Jaynee Eagles.  She had a normal NCS/EMG as well as MRI cervical spine which revealed minimal disc degenerative changes at C6-C7 but no nerve root compression or spinal stenosis and otherwise unremarkable.   Review of Systems     Objective:   Physical Exam Well-appearing, NAD  MSK: No obvious deformity.  No tenderness to palpation along midline cervical spine or paraspinal muscles.  Full strength with shoulder abduction, elbow flexion, and grip strength.  2+ radial pulse.       Assessment & Plan:   Right hand weakness  This has been extensively worked up by neurology with NCS/EMG and MRI of the cervical spine which revealed minimal degenerative changes.  She does not have any significant neck pain or radicular symptoms so would not likely have any benefit from corticosteroid injection or PRP.  Question if there could be some vascular component causing her symptoms.  We will look into how to further work this up and reach out to patient.  Zola Button, MD  PGY-2  Addendum: After discussing with my colleagues, I think it best that we refer Priscila to vascular surgery to let  them decide what if any tests need to be ordered.  We will refer her to either Dr. Doren Custard or Dr. Donzetta Matters and I will defer further work-up to their discretion.

## 2021-07-22 ENCOUNTER — Other Ambulatory Visit: Payer: Self-pay

## 2021-07-22 DIAGNOSIS — R29898 Other symptoms and signs involving the musculoskeletal system: Secondary | ICD-10-CM

## 2021-08-06 DIAGNOSIS — W57XXXS Bitten or stung by nonvenomous insect and other nonvenomous arthropods, sequela: Secondary | ICD-10-CM | POA: Diagnosis not present

## 2021-08-06 DIAGNOSIS — K219 Gastro-esophageal reflux disease without esophagitis: Secondary | ICD-10-CM | POA: Diagnosis not present

## 2021-08-09 ENCOUNTER — Other Ambulatory Visit: Payer: Self-pay

## 2021-08-09 DIAGNOSIS — R29898 Other symptoms and signs involving the musculoskeletal system: Secondary | ICD-10-CM

## 2021-08-21 ENCOUNTER — Other Ambulatory Visit: Payer: Self-pay

## 2021-08-21 ENCOUNTER — Ambulatory Visit (HOSPITAL_COMMUNITY)
Admission: RE | Admit: 2021-08-21 | Discharge: 2021-08-21 | Disposition: A | Discharge status: Home / Self Care | Payer: BC Managed Care – PPO | Source: Ambulatory Visit | Attending: Vascular Surgery | Admitting: Vascular Surgery

## 2021-08-21 ENCOUNTER — Ambulatory Visit: Payer: BC Managed Care – PPO | Admitting: Vascular Surgery

## 2021-08-21 VITALS — BP 117/76 | HR 79 | Temp 98.4°F | Resp 20 | Ht 62.0 in | Wt 134.0 lb

## 2021-08-21 DIAGNOSIS — R29898 Other symptoms and signs involving the musculoskeletal system: Secondary | ICD-10-CM
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
## 2021-09-26 ENCOUNTER — Other Ambulatory Visit: Payer: Self-pay

## 2021-09-26 DIAGNOSIS — G43009 Migraine without aura, not intractable, without status migrainosus: Secondary | ICD-10-CM

## 2021-09-26 MED ORDER — CAMBIA 50 MG PO PACK: PACK | ORAL | 4 refills | Status: DC

## 2021-10-01 ENCOUNTER — Telehealth: Payer: Self-pay | Admitting: *Deleted

## 2021-10-01 DIAGNOSIS — Z01419 Encounter for gynecological examination (general) (routine) without abnormal findings: Secondary | ICD-10-CM | POA: Diagnosis not present

## 2021-10-01 DIAGNOSIS — Z6823 Body mass index (BMI) 23.0-23.9, adult: Secondary | ICD-10-CM | POA: Diagnosis not present

## 2021-10-01 NOTE — Telephone Encounter (Signed)
Submitted PA [place] on CMM.Key: [id number] - Rx #: [id number]. Waiting on determination from [place].

## 2021-10-01 NOTE — Telephone Encounter (Signed)
PA approved effective from 10/01/2021 through 09/30/2022.

## 2021-10-09 ENCOUNTER — Other Ambulatory Visit: Payer: Self-pay | Admitting: Obstetrics and Gynecology

## 2021-10-09 DIAGNOSIS — N6320 Unspecified lump in the left breast, unspecified quadrant: Secondary | ICD-10-CM

## 2021-10-14 DIAGNOSIS — R102 Pelvic and perineal pain: Secondary | ICD-10-CM | POA: Diagnosis not present

## 2021-10-25 ENCOUNTER — Ambulatory Visit
Admission: RE | Admit: 2021-10-25 | Discharge: 2021-10-25 | Disposition: A | Discharge status: Home / Self Care | Payer: BC Managed Care – PPO | Source: Ambulatory Visit | Attending: Obstetrics and Gynecology | Admitting: Obstetrics and Gynecology

## 2021-10-25 DIAGNOSIS — N6320 Unspecified lump in the left breast, unspecified quadrant: Secondary | ICD-10-CM

## 2021-10-25 DIAGNOSIS — R922 Inconclusive mammogram: Secondary | ICD-10-CM | POA: Diagnosis not present

## 2021-10-31 ENCOUNTER — Other Ambulatory Visit: Payer: BC Managed Care – PPO

## 2021-11-21 DIAGNOSIS — N6459 Other signs and symptoms in breast: Secondary | ICD-10-CM | POA: Diagnosis not present

## 2022-01-02 ENCOUNTER — Encounter: Payer: Self-pay | Admitting: Neurology

## 2022-01-02 DIAGNOSIS — G540 Brachial plexus disorders: Secondary | ICD-10-CM | POA: Diagnosis not present

## 2022-01-16 ENCOUNTER — Encounter: Payer: Self-pay | Admitting: Neurology

## 2022-01-21 ENCOUNTER — Telehealth: Payer: BC Managed Care – PPO | Admitting: Family Medicine

## 2022-01-21 ENCOUNTER — Encounter: Payer: Self-pay | Admitting: Family Medicine

## 2022-01-21 DIAGNOSIS — R110 Nausea: Secondary | ICD-10-CM

## 2022-01-21 DIAGNOSIS — G43009 Migraine without aura, not intractable, without status migrainosus: Secondary | ICD-10-CM

## 2022-01-21 MED ORDER — ONDANSETRON 4 MG PO TBDP: ORAL_TABLET | ORAL | 5 refills | Status: DC

## 2022-01-21 MED ORDER — AJOVY 225 MG/1.5ML ~~LOC~~ SOAJ: 225.0000 mg | SUBCUTANEOUS | 3 refills | Status: DC

## 2022-01-21 MED ORDER — ZOMIG 5 MG NA SOLN: 1.0000 | NASAL | 11 refills | Status: DC | PRN

## 2022-01-21 NOTE — Progress Notes (Signed)
? ?PATIENT: [name] ?DOB: 1986/07/27 ? ?REASON FOR VISIT: follow up ?HISTORY FROM: patient ? ?Virtual Visit via Telephone Note ? ?I connected with [name] on 01/21/22 at  1:45 PM EDT by telephone and verified that I am speaking with the correct person using two identifiers. ?  ?I discussed the limitations, risks, security and privacy concerns of performing an evaluation and management service by telephone and the availability of in person appointments. I also discussed with the patient that there may be a patient responsible charge related to this service. The patient expressed understanding and agreed to proceed. ? ? ?History of Present Illness: ? ?01/21/22 ALL: ?[name] is a 36 y.o. female here today for follow up for migraines. She continues Ajovy, Zomig and ondansetron. She reports migraines are well managed. She may have 1-2 per month. Easily aborted with Zomig. She is being followed for possible thoracic outlet syndrome. She plans to start PT soon. She is seeing Dr Christena Deem, vascular surgery with [facility].  ? ?Observations/Objective: ? ?Generalized: Well developed, in no acute distress  ?Mentation: Alert oriented to time, place, history taking. Follows all commands speech and language fluent ? ? ?Assessment and Plan: ? ?36 y.o. year old female  has a past medical history of Fibroadenoma of breast (04/2017), GERD (gastroesophageal reflux disease), and Vitreous detachment of right eye. here with ? ?  ICD-10-CM   ?1. Migraine without aura and without status migrainosus, not intractable  G43.009 ZOMIG 5 MG nasal solution  ?  ondansetron (ZOFRAN ODT) 4 MG disintegrating tablet  ?  ?2. Nausea without vomiting  R11.0 ondansetron (ZOFRAN ODT) 4 MG disintegrating tablet  ?  ? ? ?Emmali is doing very well on Ajovy monthly and Zomig nasal spray and ondansetron as needed. She will continue current treatment plan. She will continue healthy lifestyle habits and close follow up with care team. She will follow  up with me in 1 year.  ? ?No orders of the defined types were placed in this encounter. ? ? ?Meds ordered this encounter  ?Medications  ? Fremanezumab-vfrm (AJOVY) 225 MG/1.5ML SOAJ  ?  Sig: Inject 225 mg into the skin every 30 (thirty) days.  ?  Dispense:  4.5 mL  ?  Refill:  3  ?  Order Specific Question:   Supervising Provider  ?  AnswerMelvenia Beam [[id number]]  ? ZOMIG 5 MG nasal solution  ?  Sig: Place 1 spray into the nose as needed for migraine.  ?  Dispense:  6 each  ?  Refill:  11  ?  Order Specific Question:   Supervising Provider  ?  AnswerMelvenia Beam [[id number]]  ? ondansetron (ZOFRAN ODT) 4 MG disintegrating tablet  ?  Sig: Take 1-2 tablet (4-8 mg total) by mouth every 8 (eight) hours as needed for nausea or vomiting.  ?  Dispense:  20 tablet  ?  Refill:  5  ?  Order Specific Question:   Supervising Provider  ?  AnswerMelvenia Beam [[id number]]  ? ? ? ?Follow Up Instructions: ? ?I discussed the assessment and treatment plan with the patient. The patient was provided an opportunity to ask questions and all were answered. The patient agreed with the plan and demonstrated an understanding of the instructions. ?  ?The patient was advised to call back or seek an in-person evaluation if the symptoms worsen or if the condition fails to improve as anticipated. ? ?I provided 15 minutes  of non-face-to-face time during this encounter. Patient located at their place of residence during [place] visit. Provider is in the office.  ? ? ?Debbora Presto, NP  ?

## 2022-01-21 NOTE — Patient Instructions (Signed)
Below is our plan: ? ?We will continue Ajovy monthly and ondansetron as well as Zomig as needed.  ? ?Please make sure you are staying well hydrated. I recommend 50-60 ounces daily. Well balanced diet and regular exercise encouraged. Consistent sleep schedule with 6-8 hours recommended.  ? ?Please continue follow up with care team as directed.  ? ?Follow up with me in 1 year  ? ?You may receive a survey regarding today's visit. I encourage you to leave honest feed back as I do use this information to improve patient care. Thank you for seeing me today!  ? ? ?

## 2022-01-27 ENCOUNTER — Other Ambulatory Visit: Payer: Self-pay

## 2022-01-27 ENCOUNTER — Encounter: Payer: Self-pay | Admitting: Physical Therapy

## 2022-01-27 ENCOUNTER — Ambulatory Visit: Payer: BC Managed Care – PPO | Attending: Vascular Surgery | Admitting: Physical Therapy

## 2022-01-27 DIAGNOSIS — R202 Paresthesia of skin: Secondary | ICD-10-CM | POA: Diagnosis not present

## 2022-01-27 DIAGNOSIS — R29818 Other symptoms and signs involving the nervous system: Secondary | ICD-10-CM | POA: Diagnosis not present

## 2022-01-27 DIAGNOSIS — R29898 Other symptoms and signs involving the musculoskeletal system: Secondary | ICD-10-CM | POA: Insufficient documentation

## 2022-01-27 DIAGNOSIS — M6281 Muscle weakness (generalized): Secondary | ICD-10-CM | POA: Insufficient documentation

## 2022-01-27 NOTE — Therapy (Signed)
[place] ?[place] ?[address] ?[address], [place], [address] ?Phone: [phone]   Fax:  [phone] ? ?Physical Therapy Evaluation ? ?Patient Details  ?Name: [name] ?MRN: [id number] ?Date of Birth: 1986/09/17 ?Referring Provider (PT): Freischlag, Almyra Free ? ? ?Encounter Date: 01/27/2022 ? ? PT End of Session - 01/27/22 1710   ? ? Visit Number 1   ? Number of Visits 20   ? Date for PT Re-Evaluation 04/07/22   ? PT Start Time [id number]   ? PT Stop Time 1710   ? PT Time Calculation (min) 38 min   ? Activity Tolerance Patient tolerated treatment well   ? Behavior During Therapy [facility] for tasks assessed/performed   ? ?  ?  ? ?  ? ? ?Past Medical History:  ?Diagnosis Date  ? 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 GERD (gastroesophageal reflux disease)   ? Vitreous detachment of right eye   ? ? ?Past Surgical History:  ?Procedure Laterality Date  ? BREAST BIOPSY Left 04/2017  ? WISDOM TOOTH EXTRACTION  2004  ? x4  ? ? ?There were no vitals filed for this visit. ? ? ? Subjective Assessment - 01/27/22 1633   ? ? Subjective patient reports 1 year H/O R UE weakness. She has seen multiple Dr. Marcelline Deist Neurologist performed NCV test and MRI of neck and head, which showed cervical arthritis. Orthopedic surgeon assessed and sent her to vascular Dr who sent her to Dr Christena Deem. She identified TOS, noting hypertrophy of her scalenes on R and general tightness in chest and anterior upper trunk. She cannot write for more than 5 minutes without success. She reports no real pain, but muscle fatigue and overall fatigue at the end of the day.   ? Patient Stated Goals manage her fatigue in her RUE, postural awareness and workspace management, be able to write without pain and fatigue.   ? Currently in Pain? No/denies   ? ?  ?  ? ?  ? ? ? ? ? [facility] PT Assessment - 01/27/22 0001   ? ?  ? Assessment  ? Medical Diagnosis TOS   ? Referring Provider (PT) Freischlag, Almyra Free   ? Hand Dominance Right   ? Prior Therapy  OT for hand weakness   ?  ? Prior Function  ? Level of Independence Independent   ? Vocation Full time employment   ? Vocation Requirements [employer]; administrative work, Social worker, paperwork   ? Leisure Reading   ?  ? Cognition  ? Overall Cognitive Status Within Functional Limits for tasks assessed   ?  ? Sensation  ? Light Touch Impaired by gross assessment;Impaired Detail   ? Light Touch Impaired Details Impaired RUE   to elbow, normal below elbow.  ?  ? Posture/Postural Control  ? Posture Comments rounded shoulders, more pronounced on L.   ?  ? ROM / Strength  ? AROM / PROM / Strength AROM;Strength   ?  ? AROM  ? Overall AROM Comments BUE and cervical ROM WNL   ?  ? Strength  ? Overall Strength Comments BUE strength grossly 4-/5 throughout- patient reports she is very sedentary   ?  ? Flexibility  ? Soft Tissue Assessment /Muscle Length yes   ?  ? Palpation  ? Palpation comment Patient is very tight in entire R upper quadrant, including scalenes, SCM, up traps, rhomboids, pect major and minor. Mildly TTP over the tight muscles.   ?  ? Special Tests  ?  Other special tests Patient with positive neural tension test on R iwth tingling and pain into R hand, improved with R lat cerv flex, increased symptoms iwth L lateral cerv flexion.   ? ?  ?  ? ?  ? ? ? ? ? ? ? ? ? ? ? ? ? ?Objective measurements completed on examination: See above findings.  ? ? ? ? ? [facility] Adult PT Treatment/Exercise - 01/27/22 0001   ? ?  ? Manual Therapy  ? Manual Therapy Soft tissue mobilization;Myofascial release;Joint mobilization;Neural Stretch   ? Joint Mobilization first rib mobs, clavicular mobs   ? Soft tissue mobilization R scalenes, up traps, rhomboid, pect minor, SCM   ? Myofascial Release ant R upper quadrant.   ? Neural Stretch glides and slides in R upper quadrant.   ? ?  ?  ? ?  ? ? ? ? ? ? ? ? ? ? PT Education - 01/27/22 1710   ? ? Education Details POC   ? Person(s) Educated Patient   ? Methods Explanation   ?  Comprehension Verbalized understanding   ? ?  ?  ? ?  ? ? ? PT Short Term Goals - 01/27/22 1801   ? ?  ? PT SHORT TERM GOAL #1  ? Title I with basic HEP   ? Time 4   ? Period Weeks   ? Status New   ? Target Date 02/24/22   ? ?  ?  ? ?  ? ? ? ? PT Long Term Goals - 01/27/22 1801   ? ?  ? PT LONG TERM GOAL #1  ? Title I with final HEP   ? Time 10   ? Period Weeks   ? Status New   ? Target Date 04/07/22   ?  ? PT LONG TERM GOAL #2  ? Title Patietn will demonstrate BUE and trunk strength of at least 4/5 to improve postural control.   ? Baseline 3+-(4-)/5   ? Time 10   ? Period Weeks   ? Status New   ? Target Date 04/07/22   ?  ? PT LONG TERM GOAL #3  ? Title Patient will test (-) in RUE neural tension test, with no tingling.   ? Baseline (+)   ? Time 10   ? Period Weeks   ? Status New   ? Target Date 04/07/22   ?  ? PT LONG TERM GOAL #4  ? Title Patient will tolerate a full day of work without C/O exhaustion or R upper quadrant fatigue.   ? Baseline Currently exhausted at the end of work day.   ? Time 10   ? Period Weeks   ? Status New   ? Target Date 04/07/22   ?  ? PT LONG TERM GOAL #5  ? Title Paitent will be able to writre with her R hand x at least 30 minutes without fatigue or tingling.   ? Baseline Less than 5 minutes.   ? Time 10   ? Period Weeks   ? Status New   ? Target Date 04/07/22   ? ?  ?  ? ?  ? ? ? ? ? ? ? ? ? Plan - 01/27/22 1711   ? ? Clinical Impression Statement Patient arrives after 1 year H/O TOS with multipel medical issues with multiple medical referrals prior to identifying the cause. She demosntrates round shoulderd posture, with very tight R upper quadrant musculature, and mild generalized  weakness throughout due to inactivity as reported by patient. Her tingling and RUE fatigue are triggered very quickly, including with writing, and she sits at a desk all day and feels very fatigued by the end of day, especially RUE. She also has weakness, whihc has forced her to avoid lifitng things iwth  RUE. She will benefit from PT for STM, stretch, strengthening, postureal education and control, and education for appropriate sitting posture while working in order to decrease the frequency of her symptoms.   ? Personal Factors and Comorbidities Fitness;Profession   ? Examination-Activity Limitations Bathing;Reach Overhead;Carry;Dressing;Lift   ? 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 Stability/Clinical Decision Making Evolving/Moderate complexity   ? Clinical Decision Making Moderate   ? Rehab Potential Good   ? PT Frequency 2x / week   ? PT Duration Other (comment)   10w  ? PT Treatment/Interventions ADLs/Self Care Home Management;Electrical Stimulation;Cryotherapy;Iontophoresis '4mg'$ /ml Dexamethasone;Moist Heat;Neuromuscular re-education;Manual techniques;Therapeutic exercise;Therapeutic activities;Functional mobility training;Passive range of motion;Dry needling;Vasopneumatic Device;Patient/family education   ? PT Next Visit Plan Initiate HEP for stretch to scalenes, SCM, up traps, rhomboids, and postural stregnth.   ? Consulted and Agree with Plan of Care Patient   ? ?  ?  ? ?  ? ? ?Patient will benefit from skilled therapeutic intervention in order to improve the following deficits and impairments:  Decreased coordination, Decreased range of motion, Increased fascial restricitons, Pain, Decreased strength, Postural dysfunction, Improper body mechanics, Impaired flexibility, Hypomobility, Decreased mobility ? ?Visit Diagnosis: ?Muscle weakness (generalized) ? ?Other symptoms and signs involving the musculoskeletal system ? ?Other symptoms and signs involving the nervous system ? ?Paresthesia of skin ? ? ? ? ?Problem List ?Patient Active Problem List  ? Diagnosis Date Noted  ? Right hand weakness 06/05/2021  ? Numbness and tingling in right hand 06/05/2021  ? Migraines 03/26/2016  ? ? ?Marcelina Morel, DPT ?01/27/2022, 6:12 PM ? ?[place] ?[place] ?[address] ?[place], [place], [address] ?Phone: [phone]   Fax:  [phone] ? ?Name: [name] ?MRN: [id number] ?Date of Birth: 17-Mar-1986 ? ? ?

## 2022-01-29 ENCOUNTER — Ambulatory Visit: Payer: BC Managed Care – PPO | Admitting: Physical Therapy

## 2022-01-29 ENCOUNTER — Other Ambulatory Visit: Payer: Self-pay

## 2022-01-29 ENCOUNTER — Encounter: Payer: Self-pay | Admitting: Physical Therapy

## 2022-01-29 DIAGNOSIS — R202 Paresthesia of skin: Secondary | ICD-10-CM

## 2022-01-29 DIAGNOSIS — R29818 Other symptoms and signs involving the nervous system: Secondary | ICD-10-CM

## 2022-01-29 DIAGNOSIS — R29898 Other symptoms and signs involving the musculoskeletal system: Secondary | ICD-10-CM

## 2022-01-29 DIAGNOSIS — M6281 Muscle weakness (generalized): Secondary | ICD-10-CM

## 2022-01-29 NOTE — Therapy (Signed)
[place] ?[place] ?[address] ?[place], [place], [address] ?Phone: [phone]   Fax:  [phone] ? ?Physical Therapy Treatment ? ?Patient Details  ?Name: [name] ?MRN: [id number] ?Date of Birth: [birth year] ?Referring Provider (PT): Freischlag, Almyra Free ? ? ?Encounter Date: 01/29/2022 ? ? PT End of Session - 01/29/22 1721   ? ? Visit Number 2   ? Number of Visits 20   ? Date for PT Re-Evaluation 04/07/22   ? PT Start Time 1608   ? PT Stop Time 0881   ? PT Time Calculation (min) 48 min   ? Activity Tolerance Patient tolerated treatment well   ? Behavior During Therapy [facility] for tasks assessed/performed   ? ?  ?  ? ?  ? ? ?Past Medical History:  ?Diagnosis Date  ? 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 GERD (gastroesophageal reflux disease)   ? Vitreous detachment of right eye   ? ? ?Past Surgical History:  ?Procedure Laterality Date  ? BREAST BIOPSY Left 04/2017  ? WISDOM TOOTH EXTRACTION  2004  ? x4  ? ? ?There were no vitals filed for this visit. ? ? Subjective Assessment - 01/29/22 1609   ? ? Subjective Just tired and feel tight   ? Currently in Pain? Yes   ? Pain Score 2    ? Pain Location Shoulder   ? Pain Orientation Right   ? Pain Descriptors / Indicators Tightness   ? Aggravating Factors  sitting at desk   ? ?  ?  ? ?  ? ? ? ? ? ? ? ? ? ? ? ? ? ? ? ? ? ? ? ? [place] Adult PT Treatment/Exercise - 01/29/22 0001   ? ?  ? Exercises  ? Exercises Shoulder   ?  ? Shoulder Exercises: ROM/Strengthening  ? UBE (Upper Arm Bike) level 1 x 4 minutes   ? Lat Pull 1.5 plate;15 reps   ? Lat Pull Limitations needs a little help as she is very weak   ? Cybex Row 1 plate;20 reps   ?  ? Shoulder Exercises: Stretch  ? Corner Stretch 3 reps;10 seconds   ? Star Gazer Stretch 3 reps;10 seconds   ?  ? Manual Therapy  ? Joint Mobilization first rib mobs, clavicular mobs   ? Soft tissue mobilization R scalenes, up traps, rhomboid, pect minor, SCM   ? Neural Stretch glides and slides in R upper  quadrant.   ? ?  ?  ? ?  ? ? ? Trigger Point Dry Needling - 01/29/22 0001   ? ? Consent Given? Yes   ? Education Handout Provided Yes   ? Muscles Treated Head and Neck Upper trapezius   ? Upper Trapezius Response Twitch reponse elicited   ? ?  ?  ? ?  ? ? ? ? ? ? ? ? ? ? PT Short Term Goals - 01/29/22 1726   ? ?  ? PT SHORT TERM GOAL #1  ? Title I with basic HEP   ? Status Partially Met   ? ?  ?  ? ?  ? ? ? ? PT Long Term Goals - 01/27/22 1801   ? ?  ? PT LONG TERM GOAL #1  ? Title I with final HEP   ? Time 10   ? Period Weeks   ? Status New   ? Target Date 04/07/22   ?  ? PT LONG TERM GOAL #2  ? Title  Patietn will demonstrate BUE and trunk strength of at least 4/5 to improve postural control.   ? Baseline 3+-(4-)/5   ? Time 10   ? Period Weeks   ? Status New   ? Target Date 04/07/22   ?  ? PT LONG TERM GOAL #3  ? Title Patient will test (-) in RUE neural tension test, with no tingling.   ? Baseline (+)   ? Time 10   ? Period Weeks   ? Status New   ? Target Date 04/07/22   ?  ? PT LONG TERM GOAL #4  ? Title Patient will tolerate a full day of work without C/O exhaustion or R upper quadrant fatigue.   ? Baseline Currently exhausted at the end of work day.   ? Time 10   ? Period Weeks   ? Status New   ? Target Date 04/07/22   ?  ? PT LONG TERM GOAL #5  ? Title Paitent will be able to writre with her R hand x at least 30 minutes without fatigue or tingling.   ? Baseline Less than 5 minutes.   ? Time 10   ? Period Weeks   ? Status New   ? Target Date 04/07/22   ? ?  ?  ? ?  ? ? ? ? ? ? ? ? Plan - 01/29/22 1722   ? ? Clinical Impression Statement Patient reports fatigue in the right arm at the end of the day, she has very poor sitting posture, we discussed this and the need to strengthen the upper back. to help woth holding posture.  I gave doorway stretch, tband rows and extension, and median nerve stretch for HEP.  She is very tight in the right upper trap, neck, pectorals.  I did some DN with good LTR's in the upper  trap.  She had some median nerve tightness, not much issue with the ulnar and radial nerve.   ? PT Next Visit Plan Initiate HEP for stretch to scalenes, SCM, up traps, rhomboids, and postural stregnth.   ? Consulted and Agree with Plan of Care Patient   ? ?  ?  ? ?  ? ? ?Patient will benefit from skilled therapeutic intervention in order to improve the following deficits and impairments:  Decreased coordination, Decreased range of motion, Increased fascial restricitons, Pain, Decreased strength, Postural dysfunction, Improper body mechanics, Impaired flexibility, Hypomobility, Decreased mobility ? ?Visit Diagnosis: ?Muscle weakness (generalized) ? ?Other symptoms and signs involving the musculoskeletal system ? ?Other symptoms and signs involving the nervous system ? ?Paresthesia of skin ? ? ? ? ?Problem List ?Patient Active Problem List  ? Diagnosis Date Noted  ? Right hand weakness 06/05/2021  ? Numbness and tingling in right hand 06/05/2021  ? Migraines 03/26/2016  ? ? Sumner Boast, PT ?01/29/2022, 5:26 PM ? ?[place] ?[place] ?[address] ?[place], [place], [address] ?Phone: [phone]   Fax:  [phone] ? ?Name: Chanae Gemma ?MRN: [id number] ?Date of Birth: 1986/07/07 ? ? ? ?

## 2022-01-29 NOTE — Patient Instructions (Addendum)
?  Access Code: [email or web address] ?URL: [email or web address] ?Date: 01/29/2022 ?Prepared by: Lum Babe ? ?Exercises ?- Scapular Retraction with Resistance  - 1 x daily - 7 x weekly - 3 sets - 10 reps - 3 hold ?- Scapular Retraction with Resistance Advanced  - 1 x daily - 7 x weekly - 3 sets - 10 reps - 3 hold ? ?Trigger Point Dry Needling ? ?What is Trigger Point Dry Needling (DN)? ?DN is a physical therapy technique used to treat muscle pain and dysfunction. Specifically, DN helps deactivate muscle trigger points (muscle knots).  ?A thin filiform needle is used to penetrate the skin and stimulate the underlying trigger point. The goal is for a local twitch response (LTR) to occur and for the trigger point to relax. No medication of any kind is injected during the procedure.  ? ?What Does Trigger Point Dry Needling Feel Like?  ?The procedure feels different for each individual patient. Some patients report that they do not actually feel the needle enter the skin and overall the process is not painful. Very mild bleeding may occur. However, many patients feel a deep cramping in the muscle in which the needle was inserted. This is the local twitch response.  ? ?How Will I feel after the treatment? ?Soreness is normal, and the onset of soreness may not occur for a few hours. Typically this soreness does not last longer than two days.  ?Bruising is uncommon, however; ice can be used to decrease any possible bruising.  ?In rare cases feeling tired or nauseous after the treatment is normal. In addition, your symptoms may get worse before they get better, this period will typically not last longer than 24 hours.  ? ?What Can I do After My Treatment? ?Increase your hydration by drinking more water for the next 24 hours. ?You may place ice or heat on the areas treated that have become sore, however, do not use heat on inflamed or bruised areas. Heat often brings more relief post needling. ?You can continue your  regular activities, but vigorous activity is not recommended initially after the treatment for 24 hours. ?DN is best combined with other physical therapy such as strengthening, stretching, and other therapies.   ?

## 2022-02-03 ENCOUNTER — Ambulatory Visit: Payer: BC Managed Care – PPO | Admitting: Physical Therapy

## 2022-02-03 ENCOUNTER — Other Ambulatory Visit: Payer: Self-pay

## 2022-02-03 DIAGNOSIS — M6281 Muscle weakness (generalized): Secondary | ICD-10-CM

## 2022-02-03 DIAGNOSIS — R29898 Other symptoms and signs involving the musculoskeletal system: Secondary | ICD-10-CM | POA: Diagnosis not present

## 2022-02-03 DIAGNOSIS — R29818 Other symptoms and signs involving the nervous system: Secondary | ICD-10-CM | POA: Diagnosis not present

## 2022-02-03 DIAGNOSIS — R202 Paresthesia of skin: Secondary | ICD-10-CM | POA: Diagnosis not present

## 2022-02-03 NOTE — Therapy (Signed)
[place] ?[place] ?[address] ?[place], [place], [address] ?Phone: [phone]   Fax:  [phone] ? ?Physical Therapy Treatment ? ?Patient Details  ?Name: [name] ?MRN: [id number] ?Date of Birth: 01/16/1986 ?Referring Provider (PT): Freischlag, Almyra Free ? ? ?Encounter Date: 02/03/2022 ? ? PT End of Session - 02/03/22 1342   ? ? Visit Number 3   ? Number of Visits 20   ? Date for PT Re-Evaluation 04/07/22   ? PT Start Time 1348   ? PT Stop Time 1426   ? PT Time Calculation (min) 38 min   ? Activity Tolerance Patient tolerated treatment well   ? Behavior During Therapy [facility] for tasks assessed/performed   ? ?  ?  ? ?  ? ? ?Past Medical History:  ?Diagnosis Date  ? 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 GERD (gastroesophageal reflux disease)   ? Vitreous detachment of right eye   ? ? ?Past Surgical History:  ?Procedure Laterality Date  ? BREAST BIOPSY Left 04/2017  ? WISDOM TOOTH EXTRACTION  2004  ? x4  ? ? ?There were no vitals filed for this visit. ? ? Subjective Assessment - 02/03/22 1349   ? ? Subjective States she takes her exercises slow as she is not used to working out. Reprots pulling the band back continues to be difficult.   ? Currently in Pain? No/denies   ? ?  ?  ? ?  ? ? ? ? ? [facility] PT Assessment - 02/03/22 0001   ? ?  ? Assessment  ? Medical Diagnosis TOS   ? Referring Provider (PT) Freischlag, Almyra Free   ? ?  ?  ? ?  ? ? ? ? ? ? ? ? ? ? ? ? ? ? ? ? [facility] Adult PT Treatment/Exercise - 02/03/22 0001   ? ?  ? Shoulder Exercises: Supine  ? Protraction AROM;10 reps;Both   2 sets  ? Other Supine Exercises chin tucks x20 5" holds PT assist   ? Other Supine Exercises pec stretch 10" holds 2 minutes total   ?  ? Shoulder Exercises: Seated  ? Other Seated Exercises wall walks with red band around arms x5 bilateral 10 feet   ? Other Seated Exercises median nerve glide x10 R, ulnar nerve glide x10 R, radial nerve glide R x10   ?  ? Shoulder Exercises: Prone  ? Other Prone  Exercises quadruped: shoulder taps 2x10 bilateral,   ?  ? Shoulder Exercises: Standing  ? Other Standing Exercises shoulder scaption reaching forward x10   ?  ? Manual Therapy  ? Manual Therapy Soft tissue mobilization;Joint mobilization   ? Manual therapy comments performed independently of other interventions   ? Joint Mobilization medial cervical glides   ? Soft tissue mobilization R scalenes, up traps, rhomboid, pect minor, SCM   ? ?  ?  ? ?  ? ? ? ? ? ? ? ? ? ? ? ? PT Short Term Goals - 01/29/22 1726   ? ?  ? PT SHORT TERM GOAL #1  ? Title I with basic HEP   ? Status Partially Met   ? ?  ?  ? ?  ? ? ? ? PT Long Term Goals - 01/27/22 1801   ? ?  ? PT LONG TERM GOAL #1  ? Title I with final HEP   ? Time 10   ? Period Weeks   ? Status New   ? Target Date 04/07/22   ?  ?  PT LONG TERM GOAL #2  ? Title Patietn will demonstrate BUE and trunk strength of at least 4/5 to improve postural control.   ? Baseline 3+-(4-)/5   ? Time 10   ? Period Weeks   ? Status New   ? Target Date 04/07/22   ?  ? PT LONG TERM GOAL #3  ? Title Patient will test (-) in RUE neural tension test, with no tingling.   ? Baseline (+)   ? Time 10   ? Period Weeks   ? Status New   ? Target Date 04/07/22   ?  ? PT LONG TERM GOAL #4  ? Title Patient will tolerate a full day of work without C/O exhaustion or R upper quadrant fatigue.   ? Baseline Currently exhausted at the end of work day.   ? Time 10   ? Period Weeks   ? Status New   ? Target Date 04/07/22   ?  ? PT LONG TERM GOAL #5  ? Title Paitent will be able to writre with her R hand x at least 30 minutes without fatigue or tingling.   ? Baseline Less than 5 minutes.   ? Time 10   ? Period Weeks   ? Status New   ? Target Date 04/07/22   ? ?  ?  ? ?  ? ? ? ? ? ? ? ? Plan - 02/03/22 1417   ? ? Clinical Impression Statement Session continued to progress strengthening exercises as tolerated. Fatigue in shoulders with wall walks. Minor dizziness noted with during session and patient reports she has a  history of BPPV but has neglected her exercises. Encouraged patient to follow up with previous BPPV exercises. Educated patient on posture and how this relates to current diagnosis of TOS. Will continue with current POC as tolerated.   ? PT Next Visit Plan f/u with dizziness. SCM, up traps, rhomboids, and postural stregnth.   ? Consulted and Agree with Plan of Care Patient   ? ?  ?  ? ?  ? ? ?Patient will benefit from skilled therapeutic intervention in order to improve the following deficits and impairments:  Decreased coordination, Decreased range of motion, Increased fascial restricitons, Pain, Decreased strength, Postural dysfunction, Improper body mechanics, Impaired flexibility, Hypomobility, Decreased mobility ? ?Visit Diagnosis: ?Muscle weakness (generalized) ? ?Other symptoms and signs involving the musculoskeletal system ? ? ? ? ?Problem List ?Patient Active Problem List  ? Diagnosis Date Noted  ? Right hand weakness 06/05/2021  ? Numbness and tingling in right hand 06/05/2021  ? Migraines 03/26/2016  ? ? ?2:27 PM, 02/03/22 ?Jerene Pitch, DPT ?Physical Therapy with [place] ?[facility]  ?[phone] office ? ? ?[place] ?[place] ?[address] ?[place], [place], [address] ?Phone: [phone]   Fax:  [phone] ? ?Name: [name] ?MRN: [id number] ?Date of Birth: 1986-06-06 ? ? ? ?

## 2022-02-05 ENCOUNTER — Ambulatory Visit: Payer: BC Managed Care – PPO | Admitting: Physical Therapy

## 2022-02-07 ENCOUNTER — Ambulatory Visit: Payer: BC Managed Care – PPO | Admitting: Physical Therapy

## 2022-02-07 ENCOUNTER — Encounter: Payer: Self-pay | Admitting: Physical Therapy

## 2022-02-07 DIAGNOSIS — R29818 Other symptoms and signs involving the nervous system: Secondary | ICD-10-CM | POA: Diagnosis not present

## 2022-02-07 DIAGNOSIS — M6281 Muscle weakness (generalized): Secondary | ICD-10-CM | POA: Diagnosis not present

## 2022-02-07 DIAGNOSIS — R202 Paresthesia of skin: Secondary | ICD-10-CM | POA: Diagnosis not present

## 2022-02-07 DIAGNOSIS — R29898 Other symptoms and signs involving the musculoskeletal system: Secondary | ICD-10-CM | POA: Diagnosis not present

## 2022-02-07 NOTE — Therapy (Signed)
[facility] ?[place] [place] ?[address] ?[place], [place], [address] ?Phone: [phone]   Fax:  [phone] ? ?Physical Therapy Treatment ? ?Patient Details  ?Name: [name] ?MRN: [id number] ?Date of Birth: 10/14/1986 ?Referring Provider (PT): Freischlag, Almyra Free ? ? ?Encounter Date: 02/07/2022 ? ? PT End of Session - 02/07/22 5176   ? ? Visit Number 4   ? Date for PT Re-Evaluation 04/07/22   ? PT Start Time [phone]   ? PT Stop Time [phone]   ? PT Time Calculation (min) 54 min   ? Activity Tolerance Patient tolerated treatment well   ? Behavior During Therapy [facility] for tasks assessed/performed   ? ?  ?  ? ?  ? ? ?Past Medical History:  ?Diagnosis Date  ? 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 GERD (gastroesophageal reflux disease)   ? Vitreous detachment of right eye   ? ? ?Past Surgical History:  ?Procedure Laterality Date  ? BREAST BIOPSY Left 04/2017  ? WISDOM TOOTH EXTRACTION  2004  ? x4  ? ? ?There were no vitals filed for this visit. ? ? Subjective Assessment - 02/07/22 0806   ? ? Subjective Had a migraine and the dizziness on Tuesday and Wednesday, reports feeling better today   ? Currently in Pain? Yes   ? Pain Score 2    ? Pain Location Shoulder   ? Pain Orientation Right   ? Aggravating Factors  sitting   ? Pain Relieving Factors sitting with better posture   ? ?  ?  ? ?  ? ? ? ? ? ? ? ? ? ? ? ? ? ? ? ? ? ? ? ? [place] Adult PT Treatment/Exercise - 02/07/22 0001   ? ?  ? Shoulder Exercises: Seated  ? Other Seated Exercises bent over row 3#, extension 2# and reverse flies 2#   ?  ? Shoulder Exercises: Standing  ? Horizontal ABduction Both;20 reps;Theraband   ? Theraband Level (Shoulder Horizontal ABduction) Level 2 (Red)   ? External Rotation Both;20 reps;Theraband   ? Theraband Level (Shoulder External Rotation) Level 2 (Red)   ? Extension Both;20 reps;Theraband   ? Theraband Level (Shoulder Extension) Level 2 (Red)   ? Row Both;20 reps;Theraband   ? Theraband Level (Shoulder Row)  Level 2 (Red)   ? Other Standing Exercises 5# shrugs with upper trap and levator stretches   ?  ? Shoulder Exercises: ROM/Strengthening  ? UBE (Upper Arm Bike) level 1 x 4 minutes   ?  ? Modalities  ? Modalities Moist Heat;Electrical Stimulation   ?  ? Moist Heat Therapy  ? Number Minutes Moist Heat 10 Minutes   ? Moist Heat Location Cervical   ?  ? Electrical Stimulation  ? Electrical Stimulation Location right upper trap   ? Electrical Stimulation Action IFC   ? Electrical Stimulation Parameters supine   ? Electrical Stimulation Goals Pain   ?  ? Manual Therapy  ? Soft tissue mobilization R scalenes, up traps, rhomboid, pect minor, SCM   ? Neural Stretch glides and slides in R upper quadrant.   ? ?  ?  ? ?  ? ? ? ? ? ? ? ? ? ? ? ? PT Short Term Goals - 02/07/22 0844   ? ?  ? PT SHORT TERM GOAL #1  ? Title I with basic HEP   ? Status Achieved   ? ?  ?  ? ?  ? ? ? ? PT  Long Term Goals - 02/07/22 0844   ? ?  ? PT LONG TERM GOAL #1  ? Title I with final HEP   ? Status On-going   ?  ? PT LONG TERM GOAL #2  ? Title Patietn will demonstrate BUE and trunk strength of at least 4/5 to improve postural control.   ? Status On-going   ? ?  ?  ? ?  ? ? ? ? ? ? ? ? Plan - 02/07/22 0842   ? ? Clinical Impression Statement After last visit patient reported some dizziness and a migraine, she thought it could have been facing the blank wall and moving, she reports tha she has had dizziness/BPPV  in the past, We returned to some exercises all focused on postureal and scapular strength and stability, I continued the STM in the upper trap and neck area, I added estim to see if this helps the soreness   ? PT Next Visit Plan see how she is doing and progress as tolerated   ? Consulted and Agree with Plan of Care Patient   ? ?  ?  ? ?  ? ? ?Patient will benefit from skilled therapeutic intervention in order to improve the following deficits and impairments:  Decreased coordination, Decreased range of motion, Increased fascial  restricitons, Pain, Decreased strength, Postural dysfunction, Improper body mechanics, Impaired flexibility, Hypomobility, Decreased mobility ? ?Visit Diagnosis: ?Muscle weakness (generalized) ? ?Other symptoms and signs involving the musculoskeletal system ? ?Other symptoms and signs involving the nervous system ? ? ? ? ?Problem List ?Patient Active Problem List  ? Diagnosis Date Noted  ? Right hand weakness 06/05/2021  ? Numbness and tingling in right hand 06/05/2021  ? Migraines 03/26/2016  ? ? Sumner Boast, PT ?02/07/2022, 8:46 AM ? ?[facility] ?[place] ?[address] ?[place], [place], [address] ?Phone: [phone]   Fax:  [phone] ? ?Name: Monice Lundy ?MRN: [id number] ?Date of Birth: 1986-06-20 ? ? ? ?

## 2022-02-11 ENCOUNTER — Ambulatory Visit: Payer: BC Managed Care – PPO | Attending: Vascular Surgery | Admitting: Physical Therapy

## 2022-02-11 ENCOUNTER — Encounter: Payer: Self-pay | Admitting: Physical Therapy

## 2022-02-11 DIAGNOSIS — M6281 Muscle weakness (generalized): Secondary | ICD-10-CM | POA: Insufficient documentation

## 2022-02-11 DIAGNOSIS — R29898 Other symptoms and signs involving the musculoskeletal system: Secondary | ICD-10-CM | POA: Insufficient documentation

## 2022-02-11 DIAGNOSIS — R29818 Other symptoms and signs involving the nervous system: Secondary | ICD-10-CM | POA: Diagnosis not present

## 2022-02-11 NOTE — Therapy (Signed)
[facility] ?[place] ?[address] ?[place], [place], [address] ?Phone: [phone]   Fax:  [phone] ? ?Physical Therapy Treatment ? ?Patient Details  ?Name: [name] ?MRN: [id number] ?Date of Birth: 03-11-1986 ?Referring Provider (PT): Freischlag, Almyra Free ? ? ?Encounter Date: 02/11/2022 ? ? PT End of Session - 02/11/22 1428   ? ? Visit Number 5   ? Date for PT Re-Evaluation 04/07/22   ? PT Start Time 1345   ? PT Stop Time 5035   ? PT Time Calculation (min) 44 min   ? Activity Tolerance Patient tolerated treatment well   ? Behavior During Therapy [facility] for tasks assessed/performed   ? ?  ?  ? ?  ? ? ?Past Medical History:  ?Diagnosis Date  ? 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 GERD (gastroesophageal reflux disease)   ? Vitreous detachment of right eye   ? ? ?Past Surgical History:  ?Procedure Laterality Date  ? BREAST BIOPSY Left 04/2017  ? WISDOM TOOTH EXTRACTION  2004  ? x4  ? ? ?There were no vitals filed for this visit. ? ? Subjective Assessment - 02/11/22 1345   ? ? Subjective Some pain at the base of her skull, could be for looking down at work   ? Currently in Pain? Yes   ? Pain Score 2    ? Pain Location Neck   ? Pain Orientation Upper   ? ?  ?  ? ?  ? ? ? ? ? ? ? ? ? ? ? ? ? ? ? ? ? ? ? ? [place] Adult PT Treatment/Exercise - 02/11/22 0001   ? ?  ? Shoulder Exercises: Standing  ? External Rotation Both;20 reps;Theraband   ? Theraband Level (Shoulder External Rotation) Level 2 (Red)   ? Flexion Strengthening;Both;20 reps;Theraband   ? Shoulder Flexion Weight (lbs) 2   ? ABduction Strengthening;Both;20 reps;Weights   ? Theraband Level (Shoulder ABduction) Level 2 (Red)   ? Extension Both;20 reps;Theraband   ? Theraband Level (Shoulder Extension) Level 2 (Red);Level 1 (Yellow)   ? Row Both;20 reps;Theraband   ? Theraband Level (Shoulder Row) Level 2 (Red)   ?  ? Shoulder Exercises: ROM/Strengthening  ? UBE (Upper Arm Bike) level 1 x 4 minutes   ?  ? Manual Therapy  ? Soft  tissue mobilization Posteriot cervical paraspinales, R scalenes, up traps, rhomboid, pect minor, SCM   ? ?  ?  ? ?  ? ? ? ? ? ? ? ? ? ? ? ? PT Short Term Goals - 02/07/22 0844   ? ?  ? PT SHORT TERM GOAL #1  ? Title I with basic HEP   ? Status Achieved   ? ?  ?  ? ?  ? ? ? ? PT Long Term Goals - 02/07/22 0844   ? ?  ? PT LONG TERM GOAL #1  ? Title I with final HEP   ? Status On-going   ?  ? PT LONG TERM GOAL #2  ? Title Patietn will demonstrate BUE and trunk strength of at least 4/5 to improve postural control.   ? Status On-going   ? ?  ?  ? ?  ? ? ? ? ? ? ? ? Plan - 02/11/22 1428   ? ? Clinical Impression Statement Pt enters clinic with reports of increase occipital pain that was relieved after MT. Continued with postural and scapular stabilization interventions. Tactile cues needed for posture with shoulder  extensions and external rotation. Positive response with MT with improved tissue elasticity.   ? Personal Factors and Comorbidities Fitness;Profession   ? Examination-Activity Limitations Bathing;Reach Overhead;Carry;Dressing;Lift   ? 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 Stability/Clinical Decision Making Evolving/Moderate complexity   ? Rehab Potential Good   ? PT Frequency 2x / week   ? PT Treatment/Interventions ADLs/Self Care Home Management;Electrical Stimulation;Cryotherapy;Iontophoresis '4mg'$ /ml Dexamethasone;Moist Heat;Neuromuscular re-education;Manual techniques;Therapeutic exercise;Therapeutic activities;Functional mobility training;Passive range of motion;Dry needling;Vasopneumatic Device;Patient/family education   ? PT Next Visit Plan see how she is doing and progress as tolerated   ? ?  ?  ? ?  ? ? ?Patient will benefit from skilled therapeutic intervention in order to improve the following deficits and impairments:  Decreased coordination, Decreased range of motion, Increased fascial restricitons, Pain, Decreased strength, Postural dysfunction, Improper  body mechanics, Impaired flexibility, Hypomobility, Decreased mobility ? ?Visit Diagnosis: ?Muscle weakness (generalized) ? ?Other symptoms and signs involving the nervous system ? ?Other symptoms and signs involving the musculoskeletal system ? ? ? ? ?Problem List ?Patient Active Problem List  ? Diagnosis Date Noted  ? Right hand weakness 06/05/2021  ? Numbness and tingling in right hand 06/05/2021  ? Migraines 03/26/2016  ? ? ?Scot Jun, PTA ?02/11/2022, 2:34 PM ? ?[facility] ?[address] ?[address] ?[place], [place], [address] ?Phone: [phone]   Fax:  [phone] ? ?Name: [name] ?MRN: [id number] ?Date of Birth: 02/07/1986 ? ? ? ?

## 2022-02-13 ENCOUNTER — Encounter: Payer: Self-pay | Admitting: Physical Therapy

## 2022-02-13 ENCOUNTER — Ambulatory Visit: Payer: BC Managed Care – PPO | Admitting: Physical Therapy

## 2022-02-13 DIAGNOSIS — R29818 Other symptoms and signs involving the nervous system: Secondary | ICD-10-CM | POA: Diagnosis not present

## 2022-02-13 DIAGNOSIS — M6281 Muscle weakness (generalized): Secondary | ICD-10-CM

## 2022-02-13 DIAGNOSIS — R29898 Other symptoms and signs involving the musculoskeletal system: Secondary | ICD-10-CM

## 2022-02-13 NOTE — Therapy (Signed)
[facility] ?[place] ?[address] ?[place], [place], [address] ?Phone: [phone]   Fax:  [phone] ? ?Physical Therapy Treatment ? ?Patient Details  ?Name: [name] ?MRN: [id number] ?Date of Birth: 1986/03/07 ?Referring Provider (PT): Freischlag, Almyra Free ? ? ?Encounter Date: 02/13/2022 ? ? PT End of Session - 02/13/22 1749   ? ? Visit Number 6   ? Number of Visits 20   ? Date for PT Re-Evaluation 04/07/22   ? PT Start Time 1702   ? PT Stop Time 3536   ? PT Time Calculation (min) 41 min   ? Activity Tolerance Patient tolerated treatment well   ? Behavior During Therapy [facility] for tasks assessed/performed   ? ?  ?  ? ?  ? ? ?Past Medical History:  ?Diagnosis Date  ? 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 GERD (gastroesophageal reflux disease)   ? Vitreous detachment of right eye   ? ? ?Past Surgical History:  ?Procedure Laterality Date  ? BREAST BIOPSY Left 04/2017  ? WISDOM TOOTH EXTRACTION  2004  ? x4  ? ? ?There were no vitals filed for this visit. ? ? Subjective Assessment - 02/13/22 1703   ? ? Subjective I'm ok today in terms of symptoms, I worked from home today and I'm a little sore from typing. Stretching and strengthening has helped, I'm very tight in the back of my neck   ? Patient Stated Goals manage her fatigue in her RUE, postural awareness and workspace management, be able to write without pain and fatigue.   ? Currently in Pain? Yes   ? Pain Score 1    ? Pain Location Neck   ? Pain Orientation Upper;Medial   ? ?  ?  ? ?  ? ? ? ? ? ? ? ? ? ? ? ? ? ? ? ? ? ? ? ? [facility] Adult PT Treatment/Exercise - 02/13/22 0001   ? ?  ? Shoulder Exercises: Seated  ? Other Seated Exercises UBE L2 3 min forward/3 min backward   ? Other Seated Exercises chin tucks x10, chin tucks with rotation x10 B, chin tucks with lateral flexion x10 B   ?  ? Shoulder Exercises: Power Tower  ? Other Power Engineer, water rows and lats 10# 2x10 each   ? Other Power Tower Exercises shoulder extensions  10# B 1x10; standing pull downs 10# 1x15   ?  ? Manual Therapy  ? Manual Therapy Soft tissue mobilization;Myofascial release   ? Soft tissue mobilization subocciptials, upper traps, rhomboids, levators   ? Myofascial Release trigger points in L UT   ? ?  ?  ? ?  ? ? ? ? ? ? ? ? ? ? PT Education - 02/13/22 1749   ? ? Education Details desk ergonomics and role in reducing symptoms   ? Person(s) Educated Patient   ? Methods Explanation   ? Comprehension Verbalized understanding   ? ?  ?  ? ?  ? ? ? PT Short Term Goals - 02/07/22 0844   ? ?  ? PT SHORT TERM GOAL #1  ? Title I with basic HEP   ? Status Achieved   ? ?  ?  ? ?  ? ? ? ? PT Long Term Goals - 02/07/22 0844   ? ?  ? PT LONG TERM GOAL #1  ? Title I with final HEP   ? Status On-going   ?  ? PT LONG TERM GOAL #  2  ? Title Patietn will demonstrate BUE and trunk strength of at least 4/5 to improve postural control.   ? Status On-going   ? ?  ?  ? ?  ? ? ? ? ? ? ? ? Plan - 02/13/22 1749   ? ? Clinical Impression Statement [name] arrives feeling well, little bit sore after typing a lot at work all day but better than next session. Continued working on general strengthening and stretching and finished with some STM. Will continue to progress as tolerated.   ? Personal Factors and Comorbidities Fitness;Profession   ? Examination-Activity Limitations Bathing;Reach Overhead;Carry;Dressing;Lift   ? 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 Stability/Clinical Decision Making Evolving/Moderate complexity   ? Clinical Decision Making Moderate   ? Rehab Potential Good   ? PT Frequency 2x / week   ? PT Duration Other (comment)   ? PT Treatment/Interventions ADLs/Self Care Home Management;Electrical Stimulation;Cryotherapy;Iontophoresis '4mg'$ /ml Dexamethasone;Moist Heat;Neuromuscular re-education;Manual techniques;Therapeutic exercise;Therapeutic activities;Functional mobility training;Passive range of motion;Dry needling;Vasopneumatic  Device;Patient/family education   ? PT Next Visit Plan see how she is doing and progress as tolerated. More DN?   ? Consulted and Agree with Plan of Care Patient   ? ?  ?  ? ?  ? ? ?Patient will benefit from skilled therapeutic intervention in order to improve the following deficits and impairments:  Decreased coordination, Decreased range of motion, Increased fascial restricitons, Pain, Decreased strength, Postural dysfunction, Improper body mechanics, Impaired flexibility, Hypomobility, Decreased mobility ? ?Visit Diagnosis: ?Muscle weakness (generalized) ? ?Other symptoms and signs involving the nervous system ? ?Other symptoms and signs involving the musculoskeletal system ? ? ? ? ?Problem List ?Patient Active Problem List  ? Diagnosis Date Noted  ? Right hand weakness 06/05/2021  ? Numbness and tingling in right hand 06/05/2021  ? Migraines 03/26/2016  ? ?Coco Sharpnack U PT, DPT, PN2  ? ?Supplemental Physical Therapist ?[facility]  ? ? ? ? ? ?[place] ?[address] ?[place], [place], [address] ?Phone: [phone]   Fax:  [phone] ? ?Name: [name] ?MRN: [id number] ?Date of Birth: 04/24/1986 ? ? ? ?

## 2022-02-19 ENCOUNTER — Ambulatory Visit: Payer: BC Managed Care – PPO | Admitting: Physical Therapy

## 2022-02-19 ENCOUNTER — Encounter: Payer: Self-pay | Admitting: Physical Therapy

## 2022-02-19 DIAGNOSIS — R29898 Other symptoms and signs involving the musculoskeletal system: Secondary | ICD-10-CM

## 2022-02-19 DIAGNOSIS — M6281 Muscle weakness (generalized): Secondary | ICD-10-CM | POA: Diagnosis not present

## 2022-02-19 DIAGNOSIS — R29818 Other symptoms and signs involving the nervous system: Secondary | ICD-10-CM | POA: Diagnosis not present

## 2022-02-19 NOTE — Therapy (Signed)
?[facility] ?[address] ?[place], [place], [address] ?Phone: [phone]   Fax:  [phone] ? ?Physical Therapy Treatment ? ?Patient Details  ?Name: [name] ?MRN: [id number] ?Date of Birth: 1986/11/08 ?Referring Provider (PT): Freischlag, Almyra Free ? ? ?Encounter Date: 02/19/2022 ? ? PT End of Session - 02/19/22 0930   ? ? Visit Number 7   ? Number of Visits 20   ? Date for PT Re-Evaluation 04/07/22   ? PT Start Time 0845   ? PT Stop Time 0930   ? PT Time Calculation (min) 45 min   ? Activity Tolerance Patient tolerated treatment well   ? Behavior During Therapy [facility] for tasks assessed/performed   ? ?  ?  ? ?  ? ? ?Past Medical History:  ?Diagnosis Date  ? 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 GERD (gastroesophageal reflux disease)   ? Vitreous detachment of right eye   ? ? ?Past Surgical History:  ?Procedure Laterality Date  ? BREAST BIOPSY Left 04/2017  ? WISDOM TOOTH EXTRACTION  2004  ? x4  ? ? ?There were no vitals filed for this visit. ? ? Subjective Assessment - 02/19/22 0850   ? ? Subjective Feeling pretty good. Shoulder is fine but starting to have more pain in the back. Used lumbar pillow at work yesterday and helped relieve some of the back pain.   ? Currently in Pain? No/denies   ? ?  ?  ? ?  ? ? ? ? ? ? ? ? ? ? ? ? ? ? ? ? ? ? ? ? [place] Adult PT Treatment/Exercise - 02/19/22 0001   ? ?  ? Shoulder Exercises: Seated  ? Other Seated Exercises UBE L1.5 3 min forward/3 min backward   ? Other Seated Exercises resisted chin tucks 2x10 yellow TB   ?  ? Shoulder Exercises: ROM/Strengthening  ? Lat Pull 20 reps   20#  ? Cybex Row 20 reps   15#  ?  ? Shoulder Exercises: Power Tower  ? Other Power Tower Exercises shoulder extensions 5# B 2x10   ?  ? Manual Therapy  ? Manual Therapy Soft tissue mobilization;Myofascial release   ? Soft tissue mobilization subocciptials, upper traps, rhomboids, levators   ? Myofascial Release trigger points in L UT   ? ?  ?  ? ?   ? ? ? ? ? ? ? ? ? ? ? ? PT Short Term Goals - 02/07/22 0844   ? ?  ? PT SHORT TERM GOAL #1  ? Title I with basic HEP   ? Status Achieved   ? ?  ?  ? ?  ? ? ? ? PT Long Term Goals - 02/07/22 0844   ? ?  ? PT LONG TERM GOAL #1  ? Title I with final HEP   ? Status On-going   ?  ? PT LONG TERM GOAL #2  ? Title Patietn will demonstrate BUE and trunk strength of at least 4/5 to improve postural control.   ? Status On-going   ? ?  ?  ? ?  ? ? ? ? ? ? ? ? Plan - 02/19/22 0931   ? ? Clinical Impression Statement Continued working on strengthening and stretching of neck/upper traps. Some UE fatigue reported with lat pull downs. Postural cues given with standing shoulder extensions. Ended with STM to help relieve tightness. Will continue to progress as tolerated.   ? Personal Factors and Comorbidities  Fitness;Profession   ? Examination-Activity Limitations Bathing;Reach Overhead;Carry;Dressing;Lift   ? 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 Stability/Clinical Decision Making Evolving/Moderate complexity   ? Rehab Potential Good   ? PT Frequency 2x / week   ? PT Next Visit Plan see how she is doing and progress as tolerated. More DN?   ? ?  ?  ? ?  ? ? ?Patient will benefit from skilled therapeutic intervention in order to improve the following deficits and impairments:  Decreased coordination, Decreased range of motion, Increased fascial restricitons, Pain, Decreased strength, Postural dysfunction, Improper body mechanics, Impaired flexibility, Hypomobility, Decreased mobility ? ?Visit Diagnosis: ?Muscle weakness (generalized) ? ?Other symptoms and signs involving the musculoskeletal system ? ?Other symptoms and signs involving the nervous system ? ? ? ? ?Problem List ?Patient Active Problem List  ? Diagnosis Date Noted  ? Right hand weakness 06/05/2021  ? Numbness and tingling in right hand 06/05/2021  ? Migraines 03/26/2016  ? ? ?Scot Jun, PTA ?02/19/2022, 9:35 AM ? ?[facility] ?[address] ?[address] ?[place], [place], [address] ?Phone: [phone]   Fax:  [phone] ? ?Name: [name] ?MRN: [id number] ?Date of Birth: 21-Feb-1986 ? ? ? ?

## 2022-02-21 ENCOUNTER — Encounter: Payer: Self-pay | Admitting: Physical Therapy

## 2022-02-21 ENCOUNTER — Ambulatory Visit: Payer: BC Managed Care – PPO | Admitting: Physical Therapy

## 2022-02-21 DIAGNOSIS — R29898 Other symptoms and signs involving the musculoskeletal system: Secondary | ICD-10-CM

## 2022-02-21 DIAGNOSIS — M6281 Muscle weakness (generalized): Secondary | ICD-10-CM

## 2022-02-21 DIAGNOSIS — R29818 Other symptoms and signs involving the nervous system: Secondary | ICD-10-CM | POA: Diagnosis not present

## 2022-02-21 NOTE — Therapy (Signed)
[facility] ?[place] ?[address] ?[place], [place], [address] ?Phone: [phone]   Fax:  [phone] ? ?Physical Therapy Treatment ? ?Patient Details  ?Name: [name] ?MRN: [id number] ?Date of Birth: September 07, 1986 ?Referring Provider (PT): Freischlag, Almyra Free ? ? ?Encounter Date: 02/21/2022 ? ? PT End of Session - 02/21/22 0936   ? ? Visit Number 8   ? Number of Visits 20   ? Date for PT Re-Evaluation 04/07/22   ? PT Start Time [phone]   ? PT Stop Time 1093   ? PT Time Calculation (min) 45 min   ? Activity Tolerance Patient tolerated treatment well   ? Behavior During Therapy [facility] for tasks assessed/performed   ? ?  ?  ? ?  ? ? ?Past Medical History:  ?Diagnosis Date  ? 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 GERD (gastroesophageal reflux disease)   ? Vitreous detachment of right eye   ? ? ?Past Surgical History:  ?Procedure Laterality Date  ? BREAST BIOPSY Left 04/2017  ? WISDOM TOOTH EXTRACTION  2004  ? x4  ? ? ?There were no vitals filed for this visit. ? ? Subjective Assessment - 02/21/22 0853   ? ? Subjective Feeling pretty good. I've been working on my posture and it's been helping the pain. It doesn't feel as tight today so that's good.   ? Patient Stated Goals manage her fatigue in her RUE, postural awareness and workspace management, be able to write without pain and fatigue.   ? Currently in Pain? No/denies   ? ?  ?  ? ?  ? ? ? ? ? ? ? ? ? ? ? ? ? ? ? ? ? ? ? ? [place] Adult PT Treatment/Exercise - 02/21/22 0001   ? ?  ? Shoulder Exercises: Seated  ? Other Seated Exercises UBE L2 3 min forward/3 min backward   ?  ? Shoulder Exercises: Standing  ? Horizontal ABduction AROM;Strengthening;10 reps;Both   2 sets; red TB  ? Diagonals AROM;Strengthening;Both;10 reps   sets; up and down, red Tb  ? Other Standing Exercises overhead throws on wall 30 secs x2   red weight ball. Attempted yellow weight ball but was unable to due to coordination.  ?  ? Shoulder Exercises: Stretch  ? Other  Shoulder Stretches cervical ext, cervical flex, cervical rotation both sides, cervical lateral bending both sides 30 secs 2x in supine   ?  ? Shoulder Exercises: Power Tower  ? Extension 10 reps   B UE; 20#  ? Row 15 reps   2 sets; 15#  ? Other Power Engineer, water lat pulls 2x15 #15   ? ?  ?  ? ?  ? ? ? ? ? ? ? ? ? ? ? ? PT Short Term Goals - 02/07/22 0844   ? ?  ? PT SHORT TERM GOAL #1  ? Title I with basic HEP   ? Status Achieved   ? ?  ?  ? ?  ? ? ? ? PT Long Term Goals - 02/07/22 0844   ? ?  ? PT LONG TERM GOAL #1  ? Title I with final HEP   ? Status On-going   ?  ? PT LONG TERM GOAL #2  ? Title Patietn will demonstrate BUE and trunk strength of at least 4/5 to improve postural control.   ? Status On-going   ? ?  ?  ? ?  ? ? ? ? ? ? ? ?  Plan - 02/21/22 0938   ? ? Clinical Impression Statement Patient stated she wasn't as tight today so we focused on strengthening upper traps and shoulders. VC's needed for correct posture w/ diagonals and overhead throws.   ? Personal Factors and Comorbidities Fitness;Profession   ? Examination-Activity Limitations Bathing;Reach Overhead;Carry;Dressing;Lift   ? Stability/Clinical Decision Making Evolving/Moderate complexity   ? Clinical Decision Making Moderate   ? Rehab Potential Good   ? PT Frequency 2x / week   ? PT Duration Other (comment)   ? PT Treatment/Interventions ADLs/Self Care Home Management;Electrical Stimulation;Cryotherapy;Iontophoresis '4mg'$ /ml Dexamethasone;Moist Heat;Neuromuscular re-education;Manual techniques;Therapeutic exercise;Therapeutic activities;Functional mobility training;Passive range of motion;Dry needling;Vasopneumatic Device;Patient/family education   ? PT Next Visit Plan Progress as tolerated.   ? Consulted and Agree with Plan of Care Patient   ? ?  ?  ? ?  ? ? ?Patient will benefit from skilled therapeutic intervention in order to improve the following deficits and impairments:  Decreased coordination, Decreased range of motion, Increased  fascial restricitons, Pain, Decreased strength, Postural dysfunction, Improper body mechanics, Impaired flexibility, Hypomobility, Decreased mobility ? ?Visit Diagnosis: ?Muscle weakness (generalized) ? ?Other symptoms and signs involving the musculoskeletal system ? ?Other symptoms and signs involving the nervous system ? ? ? ? ?Problem List ?Patient Active Problem List  ? Diagnosis Date Noted  ? Right hand weakness 06/05/2021  ? Numbness and tingling in right hand 06/05/2021  ? Migraines 03/26/2016  ? ? ?[name] ?02/21/2022, 9:42 AM ? ?[facility] ?[address] ?[address] ?[place], [place], [address] ?Phone: [phone]   Fax:  [phone] ? ?Name: [name] ?MRN: [id number] ?Date of Birth: 09-01-1986 ? ? ? ?

## 2022-02-24 ENCOUNTER — Ambulatory Visit: Payer: BC Managed Care – PPO | Admitting: Physical Therapy

## 2022-02-24 ENCOUNTER — Encounter: Payer: Self-pay | Admitting: Physical Therapy

## 2022-02-24 DIAGNOSIS — R29898 Other symptoms and signs involving the musculoskeletal system: Secondary | ICD-10-CM | POA: Diagnosis not present

## 2022-02-24 DIAGNOSIS — M6281 Muscle weakness (generalized): Secondary | ICD-10-CM | POA: Diagnosis not present

## 2022-02-24 DIAGNOSIS — R29818 Other symptoms and signs involving the nervous system: Secondary | ICD-10-CM | POA: Diagnosis not present

## 2022-02-24 NOTE — Therapy (Signed)
[place] ?[place] ?[address] ?[place], [place], [address] ?Phone: [phone]   Fax:  [phone] ? ?Physical Therapy Treatment ? ?Patient Details  ?Name: [name] ?MRN: [id number] ?Date of Birth: June 22, 1986 ?Referring Provider (PT): Freischlag, Almyra Free ? ? ?Encounter Date: 02/24/2022 ? ? PT End of Session - 02/24/22 1975   ? ? Visit Number 9   ? Number of Visits 20   ? Date for PT Re-Evaluation 04/07/22   ? PT Start Time [phone]   ? PT Stop Time 0925   ? PT Time Calculation (min) 41 min   ? Activity Tolerance Patient tolerated treatment well   ? Behavior During Therapy [facility] for tasks assessed/performed   ? ?  ?  ? ?  ? ? ?Past Medical History:  ?Diagnosis Date  ? 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 GERD (gastroesophageal reflux disease)   ? Vitreous detachment of right eye   ? ? ?Past Surgical History:  ?Procedure Laterality Date  ? BREAST BIOPSY Left 04/2017  ? WISDOM TOOTH EXTRACTION  2004  ? x4  ? ? ?There were no vitals filed for this visit. ? ? Subjective Assessment - 02/24/22 0841   ? ? Subjective I know i am getting stronger, less hand issues and less HA. neck is still stiff and tired   ? Currently in Pain? Yes   ? Pain Score 2    ? Pain Location Neck   ? Pain Descriptors / Indicators Tightness   ? Pain Relieving Factors I think this is helping   ? ?  ?  ? ?  ? ? ? ? ? ? ? ? ? ? ? ? ? ? ? ? ? ? ? ? [place] Adult PT Treatment/Exercise - 02/24/22 0001   ? ?  ? Shoulder Exercises: Seated  ? Other Seated Exercises UBE L2 3 min forward/3 min backward   ? Other Seated Exercises bent over trow 5%, extension 3#,   ?  ? Shoulder Exercises: Standing  ? Horizontal ABduction Both;20 reps;Theraband   ? Theraband Level (Shoulder Horizontal ABduction) Level 2 (Red)   ? External Rotation Both;20 reps;Theraband   ? Theraband Level (Shoulder External Rotation) Level 2 (Red)   ? Extension Both;20 reps;Theraband   ? Theraband Level (Shoulder Extension) Level 2 (Red);Level 1 (Yellow)   ?   ? Shoulder Exercises: ROM/Strengthening  ? Lat Pull 2 plate;20 reps   ? Cybex Row 1.5 plate;20 reps   ?  ? Manual Therapy  ? Manual Therapy Soft tissue mobilization;Myofascial release   ? Soft tissue mobilization subocciptials, upper traps, rhomboids, levators   ? Neural Stretch glides and slides in R upper quadrant.   ? ?  ?  ? ?  ? ? ? ? ? ? ? ? ? ? ? ? PT Short Term Goals - 02/07/22 0844   ? ?  ? PT SHORT TERM GOAL #1  ? Title I with basic HEP   ? Status Achieved   ? ?  ?  ? ?  ? ? ? ? PT Long Term Goals - 02/24/22 0925   ? ?  ? PT LONG TERM GOAL #1  ? Title I with final HEP   ? Status Partially Met   ? ?  ?  ? ?  ? ? ? ? ? ? ? ? Plan - 02/24/22 0923   ? ? Clinical Impression Statement Patient reports that she is feeling stronger and feeling better, She had some shooting  tingling into the right arm pit and to the elbow with some STM to the right scalene area.  I added a few seated exercises and gave her green tband to HEP, assured that she is doing the UE and neck stretches   ? PT Next Visit Plan Progress as tolerated.   ? Consulted and Agree with Plan of Care Patient   ? ?  ?  ? ?  ? ? ?Patient will benefit from skilled therapeutic intervention in order to improve the following deficits and impairments:  Decreased coordination, Decreased range of motion, Increased fascial restricitons, Pain, Decreased strength, Postural dysfunction, Improper body mechanics, Impaired flexibility, Hypomobility, Decreased mobility ? ?Visit Diagnosis: ?Muscle weakness (generalized) ? ?Other symptoms and signs involving the musculoskeletal system ? ?Other symptoms and signs involving the nervous system ? ? ? ? ?Problem List ?Patient Active Problem List  ? Diagnosis Date Noted  ? Right hand weakness 06/05/2021  ? Numbness and tingling in right hand 06/05/2021  ? Migraines 03/26/2016  ? ? Sumner Boast, PT ?02/24/2022, 9:26 AM ? ? ?[place] ?[address] ?[place], [place],  [address] ?Phone: [phone]   Fax:  [phone] ? ?Name: Deeya Richeson ?MRN: [id number] ?Date of Birth: 29-Sep-1986 ? ? ? ?

## 2022-02-26 ENCOUNTER — Ambulatory Visit: Payer: BC Managed Care – PPO | Admitting: Physical Therapy

## 2022-02-26 ENCOUNTER — Encounter: Payer: Self-pay | Admitting: Physical Therapy

## 2022-02-26 DIAGNOSIS — M6281 Muscle weakness (generalized): Secondary | ICD-10-CM | POA: Diagnosis not present

## 2022-02-26 DIAGNOSIS — R29818 Other symptoms and signs involving the nervous system: Secondary | ICD-10-CM

## 2022-02-26 DIAGNOSIS — R29898 Other symptoms and signs involving the musculoskeletal system: Secondary | ICD-10-CM

## 2022-02-26 NOTE — Therapy (Signed)
[place] ?[place] ?[address] ?[place], [place], [address] ?Phone: [phone]   Fax:  [phone] ? ?Physical Therapy Treatment ? ?Patient Details  ?Name: [name] ?MRN: [id number] ?Date of Birth: Sep 13, 1986 ?Referring Provider (PT): Freischlag, Almyra Free ? ? ?Encounter Date: 02/26/2022 ? ? PT End of Session - 02/26/22 0924   ? ? Visit Number 10   ? Date for PT Re-Evaluation 04/07/22   ? PT Start Time [phone]   ? PT Stop Time 0925   ? PT Time Calculation (min) 44 min   ? Activity Tolerance Patient tolerated treatment well   ? Behavior During Therapy [facility] for tasks assessed/performed   ? ?  ?  ? ?  ? ? ?Past Medical History:  ?Diagnosis Date  ? 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 GERD (gastroesophageal reflux disease)   ? Vitreous detachment of right eye   ? ? ?Past Surgical History:  ?Procedure Laterality Date  ? BREAST BIOPSY Left 04/2017  ? WISDOM TOOTH EXTRACTION  2004  ? x4  ? ? ?There were no vitals filed for this visit. ? ? Subjective Assessment - 02/26/22 0842   ? ? Subjective Reports that she is trying to help her ergonomic set up, reports doing a little better overall   ? Currently in Pain? Yes   ? Pain Score 2    ? Pain Location Neck   ? Pain Radiating Towards reports that she is having less arm issues   ? Aggravating Factors  work   ? ?  ?  ? ?  ? ? ? ? ? ? ? ? ? ? ? ? ? ? ? ? ? ? ? ? [place] Adult PT Treatment/Exercise - 02/26/22 0001   ? ?  ? Therapeutic Activites   ? Therapeutic Activities Work Simulation   ? Work Economist went over Education officer, community up seat adjustments arm and head positions keyboard and mouse   ?  ? Shoulder Exercises: Supine  ? Other Supine Exercises neck retraction against ball 3 ways   ?  ? Shoulder Exercises: Seated  ? Other Seated Exercises UBE L3 3 min forward/3 min backward   ? Other Seated Exercises bent over trow 5#, extension 3#,   ?  ? Shoulder Exercises: Standing  ? Horizontal ABduction Both;20 reps;Theraband   ? Theraband Level (Shoulder  Horizontal ABduction) Level 2 (Red)   ? External Rotation Both;20 reps;Theraband   ? Theraband Level (Shoulder External Rotation) Level 2 (Red)   ? Other Standing Exercises I's Y's and T's 2#   ? Other Standing Exercises 5# shrugs with upper trap and levator stretches   ?  ? Shoulder Exercises: ROM/Strengthening  ? Lat Pull 2 plate;20 reps   ? Cybex Row 2 plate;20 reps   ? Other ROM/Strengthening Exercises 5# straight arm pulls cues for posture   ?  ? Manual Therapy  ? Manual Therapy Soft tissue mobilization;Myofascial release;Manual Traction   ? Manual therapy comments passive stretch with passive shoulder depression   ? Manual Traction --   occipital release and traction  ? ?  ?  ? ?  ? ? ? [place] Dry Needling - 02/26/22 0001   ? ? Consent Given? Yes   ? Upper Trapezius Response Twitch reponse elicited   ? ?  ?  ? ?  ? ? ? ? ? ? ? ? ? ? PT Short Term Goals - 02/07/22 0844   ? ?  ? PT SHORT TERM GOAL #  1  ? Title I with basic HEP   ? Status Achieved   ? ?  ?  ? ?  ? ? ? ? PT Long Term Goals - 02/26/22 0925   ? ?  ? PT LONG TERM GOAL #2  ? Title Patietn will demonstrate BUE and trunk strength of at least 4/5 to improve postural control.   ? Status Partially Met   ?  ? PT LONG TERM GOAL #3  ? Title Patient will test (-) in RUE neural tension test, with no tingling.   ? Status Partially Met   ?  ? PT LONG TERM GOAL #4  ? Title Patient will tolerate a full day of work without C/O exhaustion or R upper quadrant fatigue.   ? Status Partially Met   ?  ? PT LONG TERM GOAL #5  ? Title Paitent will be able to writre with her R hand x at least 30 minutes without fatigue or tingling.   ? Status On-going   ? ?  ?  ? ?  ? ? ? ? ? ? ? ? Plan - 02/26/22 0924   ? ? Clinical Impression Statement Patient is doing better, stronger, less arm and hand issues, she is still weak and has difficulty relaxing her shoulders tends to elevate and activate the upper traps, had a lot of LTR's in the left upper trap today   ? PT Next Visit  Plan Progress as tolerated.   ? Consulted and Agree with Plan of Care Patient   ? ?  ?  ? ?  ? ? ?Patient will benefit from skilled therapeutic intervention in order to improve the following deficits and impairments:  Decreased coordination, Decreased range of motion, Increased fascial restricitons, Pain, Decreased strength, Postural dysfunction, Improper body mechanics, Impaired flexibility, Hypomobility, Decreased mobility ? ?Visit Diagnosis: ?Muscle weakness (generalized) ? ?Other symptoms and signs involving the musculoskeletal system ? ?Other symptoms and signs involving the nervous system ? ? ? ? ?Problem List ?Patient Active Problem List  ? Diagnosis Date Noted  ? Right hand weakness 06/05/2021  ? Numbness and tingling in right hand 06/05/2021  ? Migraines 03/26/2016  ? ? Sumner Boast, PT ?02/26/2022, 9:26 AM ? ?[place] ?[place] ?[address] ?[place], [place], [address] ?Phone: [phone]   Fax:  [phone] ? ?Name: [name] ?MRN: [id number] ?Date of Birth: March 04, 1986 ? ? ? ?

## 2022-03-05 ENCOUNTER — Encounter: Payer: Self-pay | Admitting: Physical Therapy

## 2022-03-05 ENCOUNTER — Ambulatory Visit: Payer: BC Managed Care – PPO | Admitting: Physical Therapy

## 2022-03-05 DIAGNOSIS — R29818 Other symptoms and signs involving the nervous system: Secondary | ICD-10-CM | POA: Diagnosis not present

## 2022-03-05 DIAGNOSIS — M6281 Muscle weakness (generalized): Secondary | ICD-10-CM

## 2022-03-05 DIAGNOSIS — R29898 Other symptoms and signs involving the musculoskeletal system: Secondary | ICD-10-CM | POA: Diagnosis not present

## 2022-03-05 NOTE — Therapy (Signed)
[place] ?[place] ?[address] ?[place], [place], [address] ?Phone: [phone]   Fax:  [phone] ? ?Physical Therapy Treatment ? ?Patient Details  ?Name: [name] ?MRN: [id number] ?Date of Birth: Mar 14, 1986 ?Referring Provider (PT): Freischlag, Almyra Free ? ? ?Encounter Date: 03/05/2022 ? ? PT End of Session - 03/05/22 1618   ? ? Visit Number 11   ? Number of Visits 20   ? Date for PT Re-Evaluation 04/07/22   ? PT Start Time 1545   ? PT Stop Time 1628   ? PT Time Calculation (min) 43 min   ? Activity Tolerance Patient tolerated treatment well   ? Behavior During Therapy [facility] for tasks assessed/performed   ? ?  ?  ? ?  ? ? ?Past Medical History:  ?Diagnosis Date  ? 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 GERD (gastroesophageal reflux disease)   ? Vitreous detachment of right eye   ? ? ?Past Surgical History:  ?Procedure Laterality Date  ? BREAST BIOPSY Left 04/2017  ? WISDOM TOOTH EXTRACTION  2004  ? x4  ? ? ?There were no vitals filed for this visit. ? ? Subjective Assessment - 03/05/22 1547   ? ? Subjective I'm doing good, trying to work on my posture at work and at home.   ? Patient Stated Goals manage her fatigue in her RUE, postural awareness and workspace management, be able to write without pain and fatigue.   ? Currently in Pain? No/denies   ? ?  ?  ? ?  ? ? ? ? ? ? ? ? ? ? ? ? ? ? ? ? ? ? ? ? [place] Adult PT Treatment/Exercise - 03/05/22 0001   ? ?  ? Shoulder Exercises: Standing  ? Other Standing Exercises I's Y's T's 2#; overhead flex w/ trunk ext w/ yellow weight ball   ? Other Standing Exercises shrugs 2x10 6#   ?  ? Shoulder Exercises: ROM/Strengthening  ? UBE (Upper Arm Bike) UBE lvl 4x 6 mins; 3 mins fwd and 3 mins backward   ? Lat Pull 10 reps   ? Lat Pull Limitations 2 sets 25#   ? Cybex Row 10 reps   ? Cybex Row Limitations 2 sets 25#   ?  ? Shoulder Exercises: Power Tower  ? Extension 10 reps   ? Extension Limitations 2 sets 10#   ? Other Power Tower  Exercises 2x10 chest press 10#   ? Other Power Tower Exercises trunk rotation 2x10 both sides 10#   ? ?  ?  ? ?  ? ? ? ? ? ? ? ? ? ? ? ? PT Short Term Goals - 02/07/22 0844   ? ?  ? PT SHORT TERM GOAL #1  ? Title I with basic HEP   ? Status Achieved   ? ?  ?  ? ?  ? ? ? ? PT Long Term Goals - 02/26/22 0925   ? ?  ? PT LONG TERM GOAL #2  ? Title Patietn will demonstrate BUE and trunk strength of at least 4/5 to improve postural control.   ? Status Partially Met   ?  ? PT LONG TERM GOAL #3  ? Title Patient will test (-) in RUE neural tension test, with no tingling.   ? Status Partially Met   ?  ? PT LONG TERM GOAL #4  ? Title Patient will tolerate a full day of work without C/O exhaustion or R upper  quadrant fatigue.   ? Status Partially Met   ?  ? PT LONG TERM GOAL #5  ? Title Paitent will be able to writre with her R hand x at least 30 minutes without fatigue or tingling.   ? Status On-going   ? ?  ?  ? ?  ? ? ? ? ? ? ? ? Plan - 03/05/22 1604   ? ? Clinical Impression Statement Patient came in doing good. She hasn't had pain in her neck and upper traps since DN. Patient did well overall. VC's needed for correct posture during Y's.   ? Personal Factors and Comorbidities Fitness;Profession   ? Examination-Activity Limitations Bathing;Reach Overhead;Carry;Dressing;Lift   ? 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 Stability/Clinical Decision Making Evolving/Moderate complexity   ? Clinical Decision Making Moderate   ? Rehab Potential Good   ? PT Frequency 2x / week   ? PT Duration Other (comment)   ? PT Treatment/Interventions ADLs/Self Care Home Management;Electrical Stimulation;Cryotherapy;Iontophoresis 3m/ml Dexamethasone;Moist Heat;Neuromuscular re-education;Manual techniques;Therapeutic exercise;Therapeutic activities;Functional mobility training;Passive range of motion;Dry needling;Vasopneumatic Device;Patient/family education   ? PT Next Visit Plan Progress as tolerated.    ? Consulted and Agree with Plan of Care Patient   ? ?  ?  ? ?  ? ? ?Patient will benefit from skilled therapeutic intervention in order to improve the following deficits and impairments:  Decreased coordination, Decreased range of motion, Increased fascial restricitons, Pain, Decreased strength, Postural dysfunction, Improper body mechanics, Impaired flexibility, Hypomobility, Decreased mobility ? ?Visit Diagnosis: ?Other symptoms and signs involving the nervous system ? ?Other symptoms and signs involving the musculoskeletal system ? ?Muscle weakness (generalized) ? ? ? ? ?Problem List ?Patient Active Problem List  ? Diagnosis Date Noted  ? Right hand weakness 06/05/2021  ? Numbness and tingling in right hand 06/05/2021  ? Migraines 03/26/2016  ? ? ?[name]?03/05/2022, 4:32 PM ? ?[place] ?[place] ?[place] [address]?Phone: [phone]  Fax:  [phone]? ?Name: [name]?MRN: [id number]?Date of Birth: 5Jun 30, 1987? ? ? ?

## 2022-03-07 ENCOUNTER — Encounter: Payer: Self-pay | Admitting: Physical Therapy

## 2022-03-07 ENCOUNTER — Ambulatory Visit: Payer: BC Managed Care – PPO | Admitting: Physical Therapy

## 2022-03-07 DIAGNOSIS — R29818 Other symptoms and signs involving the nervous system: Secondary | ICD-10-CM | POA: Diagnosis not present

## 2022-03-07 DIAGNOSIS — M6281 Muscle weakness (generalized): Secondary | ICD-10-CM | POA: Diagnosis not present

## 2022-03-07 DIAGNOSIS — R29898 Other symptoms and signs involving the musculoskeletal system: Secondary | ICD-10-CM

## 2022-03-07 NOTE — Therapy (Signed)
?[place] ?[address] ?[place], [place], [address] ?Phone: [phone]   Fax:  [phone] ? ?Physical Therapy Treatment ? ?Patient Details  ?Name: [name] ?MRN: [id number] ?Date of Birth: 01-Nov-1986 ?Referring Provider (PT): Freischlag, Almyra Free ? ? ?Encounter Date: 03/07/2022 ? ? PT End of Session - 03/07/22 1007   ? ? Visit Number 12   ? Date for PT Re-Evaluation 04/07/22   ? PT Start Time 0930   ? PT Stop Time 1013   ? PT Time Calculation (min) 43 min   ? Activity Tolerance Patient tolerated treatment well   ? Behavior During Therapy [facility] for tasks assessed/performed   ? ?  ?  ? ?  ? ? ?Past Medical History:  ?Diagnosis Date  ? 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 GERD (gastroesophageal reflux disease)   ? Vitreous detachment of right eye   ? ? ?Past Surgical History:  ?Procedure Laterality Date  ? BREAST BIOPSY Left 04/2017  ? WISDOM TOOTH EXTRACTION  2004  ? x4  ? ? ?There were no vitals filed for this visit. ? ? Subjective Assessment - 03/07/22 0933   ? ? Subjective Doing ok, just tired   ? Currently in Pain? No/denies   ? ?  ?  ? ?  ? ? ? ? ? ? ? ? ? ? ? ? ? ? ? ? ? ? ? ? [name] Adult PT Treatment/Exercise - 03/07/22 0001   ? ?  ? Shoulder Exercises: Standing  ? Horizontal ABduction Both;20 reps;Theraband   ? Theraband Level (Shoulder Horizontal ABduction) Level 2 (Red)   ? External Rotation Both;20 reps;Theraband   ? Theraband Level (Shoulder External Rotation) Level 3 (Green)   ? Extension Strengthening;Both;20 reps;Weights   ? Extension Weight (lbs) 5   ? Other Standing Exercises Tricep Ext 25lb 2x10   ? Other Standing Exercises Cervical retraction yellow 2x10; shrugs w/ levator stretch 5lb 2x10   ?  ? Shoulder Exercises: ROM/Strengthening  ? UBE (Upper Arm Bike) UBE lvl 2x 6 mins; 3 mins fwd and 3 mins backward   ? Other ROM/Strengthening Exercises 25lb rows and lats 2x10   ?  ? Shoulder Exercises: Power Tower  ? Other Power Tower Exercises 2x10 chest  press 15#   ? ?  ?  ? ?  ? ? ? ? ? ? ? ? ? ? ? ? PT Short Term Goals - 02/07/22 0844   ? ?  ? PT SHORT TERM GOAL #1  ? Title I with basic HEP   ? Status Achieved   ? ?  ?  ? ?  ? ? ? ? PT Long Term Goals - 03/07/22 1007   ? ?  ? PT LONG TERM GOAL #1  ? Title I with final HEP   ? Status Achieved   ?  ? PT LONG TERM GOAL #2  ? Title Patietn will demonstrate BUE and trunk strength of at least 4/5 to improve postural control.   ? Status Partially Met   ?  ? PT LONG TERM GOAL #3  ? Title Patient will test (-) in RUE neural tension test, with no tingling.   ? Status Partially Met   ?  ? PT LONG TERM GOAL #4  ? Title Patient will tolerate a full day of work without C/O exhaustion or R upper quadrant fatigue.   ? Status Partially Met   ?  ? PT LONG TERM GOAL #5  ? Title  Paitent will be able to writre with her R hand x at least 30 minutes without fatigue or tingling.   ? Status On-going   has not tried, but typing is ok  ? ?  ?  ? ?  ? ? ? ? ? ? ? ? Plan - 03/07/22 1008   ? ? Clinical Impression Statement Pt enters feeling fine with no pain, continues progression of postural strength. Pt did well completing the interventions. Postural cue required with horizontal abduction. Burning fatigue sensation reported with chest press and external rotation. No reports of increase pain during session.   ? Examination-Activity Limitations Bathing;Reach Overhead;Carry;Dressing;Lift   ? 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 Stability/Clinical Decision Making Evolving/Moderate complexity   ? Rehab Potential Good   ? PT Frequency 2x / week   ? PT Treatment/Interventions ADLs/Self Care Home Management;Electrical Stimulation;Cryotherapy;Iontophoresis 50m/ml Dexamethasone;Moist Heat;Neuromuscular re-education;Manual techniques;Therapeutic exercise;Therapeutic activities;Functional mobility training;Passive range of motion;Dry needling;Vasopneumatic Device;Patient/family education   ? PT Next Visit  Plan Progress as tolerated.   ? ?  ?  ? ?  ? ? ?Patient will benefit from skilled therapeutic intervention in order to improve the following deficits and impairments:  Decreased coordination, Decreased range of motion, Increased fascial restricitons, Pain, Decreased strength, Postural dysfunction, Improper body mechanics, Impaired flexibility, Hypomobility, Decreased mobility ? ?Visit Diagnosis: ?Other symptoms and signs involving the nervous system ? ?Other symptoms and signs involving the musculoskeletal system ? ?Muscle weakness (generalized) ? ? ? ? ?Problem List ?Patient Active Problem List  ? Diagnosis Date Noted  ? Right hand weakness 06/05/2021  ? Numbness and tingling in right hand 06/05/2021  ? Migraines 03/26/2016  ? ? ?RScot Jun PTA ?03/07/2022, 10:13 AM ? ?[place] ?[place] ?[place] [address]?Phone: [phone]  Fax:  [phone]? ?Name: [name]?MRN: [id number]?Date of Birth: [birth year]? ? ? ?

## 2022-03-12 ENCOUNTER — Ambulatory Visit: Payer: BC Managed Care – PPO | Attending: Vascular Surgery | Admitting: Physical Therapy

## 2022-03-12 ENCOUNTER — Encounter: Payer: Self-pay | Admitting: Physical Therapy

## 2022-03-12 DIAGNOSIS — R29898 Other symptoms and signs involving the musculoskeletal system: Secondary | ICD-10-CM | POA: Diagnosis not present

## 2022-03-12 DIAGNOSIS — M6281 Muscle weakness (generalized): Secondary | ICD-10-CM | POA: Insufficient documentation

## 2022-03-12 DIAGNOSIS — R29818 Other symptoms and signs involving the nervous system: Secondary | ICD-10-CM | POA: Insufficient documentation

## 2022-03-12 NOTE — Therapy (Signed)
[place] ?[place] ?[address] ?[place], [place], [address] ?Phone: [phone]   Fax:  [phone] ? ?Physical Therapy Treatment ? ?Patient Details  ?Name: Cecilie Heidel ?MRN: [id number] ?Date of Birth: May 07, 1986 ?Referring Provider (PT): Freischlag, Almyra Free ? ? ?Encounter Date: 03/12/2022 ? ? PT End of Session - 03/12/22 0913   ? ? Visit Number 13   ? Number of Visits 20   ? Date for PT Re-Evaluation 04/07/22   ? PT Start Time [phone]   ? PT Stop Time 0929   ? PT Time Calculation (min) 45 min   ? Activity Tolerance Patient tolerated treatment well   ? Behavior During Therapy [facility] for tasks assessed/performed   ? ?  ?  ? ?  ? ? ?Past Medical History:  ?Diagnosis Date  ? 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 GERD (gastroesophageal reflux disease)   ? Vitreous detachment of right eye   ? ? ?Past Surgical History:  ?Procedure Laterality Date  ? BREAST BIOPSY Left 04/2017  ? WISDOM TOOTH EXTRACTION  2004  ? x4  ? ? ?There were no vitals filed for this visit. ? ? Subjective Assessment - 03/12/22 0845   ? ? Subjective I have had  really bad HA the past two days   ? Currently in Pain? Yes   ? Pain Score 3    ? Pain Location Neck   HA  ? Pain Descriptors / Indicators Aching;Headache   ? Aggravating Factors  stress   ? ?  ?  ? ?  ? ? ? ? ? ? ? ? ? ? ? ? ? ? ? ? ? ? ? ? [place] Adult PT Treatment/Exercise - 03/12/22 0001   ? ?  ? Modalities  ? Modalities Cryotherapy   ?  ? Moist Heat Therapy  ? Number Minutes Moist Heat 10 Minutes   ? Moist Heat Location Cervical   ?  ? Cryotherapy  ? Number Minutes Cryotherapy 10 Minutes   ? Cryotherapy Location --   occipital area  ? Type of Cryotherapy Ice pack   ?  ? Electrical Stimulation  ? Electrical Stimulation Location C/T area   ? Electrical Stimulation Action IFC   ? Electrical Stimulation Parameters supine   ? Electrical Stimulation Goals Pain   ?  ? Manual Therapy  ? Manual Therapy Soft tissue mobilization;Myofascial release;Manual Traction   ?  Manual therapy comments passive stretch with passive shoulder depression   ? Soft tissue mobilization subocciptials, upper traps, rhomboids, levators   ? Manual Traction occipital release   ? ?  ?  ? ?  ? ? ? ? ? ? ? ? ? ? ? ? PT Short Term Goals - 02/07/22 0844   ? ?  ? PT SHORT TERM GOAL #1  ? Title I with basic HEP   ? Status Achieved   ? ?  ?  ? ?  ? ? ? ? PT Long Term Goals - 03/12/22 0915   ? ?  ? PT LONG TERM GOAL #2  ? Title Patietn will demonstrate BUE and trunk strength of at least 4/5 to improve postural control.   ? Status Partially Met   ?  ? PT LONG TERM GOAL #3  ? Title Patient will test (-) in RUE neural tension test, with no tingling.   ? Status Partially Met   ? ?  ?  ? ?  ? ? ? ? ? ? ? ? Plan -  03/12/22 0914   ? ? Clinical Impression Statement Patient came in with a significant HA, she reports having the past few days, nothing "seems to help"  Tight in the cervical and the upper traps.   ? PT Next Visit Plan focused treatment on the HA issues see how she is doing   ? Consulted and Agree with Plan of Care Patient   ? ?  ?  ? ?  ? ? ?Patient will benefit from skilled therapeutic intervention in order to improve the following deficits and impairments:  Decreased coordination, Decreased range of motion, Increased fascial restricitons, Pain, Decreased strength, Postural dysfunction, Improper body mechanics, Impaired flexibility, Hypomobility, Decreased mobility ? ?Visit Diagnosis: ?Other symptoms and signs involving the nervous system ? ?Other symptoms and signs involving the musculoskeletal system ? ?Muscle weakness (generalized) ? ? ? ? ?Problem List ?Patient Active Problem List  ? Diagnosis Date Noted  ? Right hand weakness 06/05/2021  ? Numbness and tingling in right hand 06/05/2021  ? Migraines 03/26/2016  ? ? Sumner Boast, PT ?03/12/2022, 9:15 AM ? ?[place] ?[address] ?[address] ?[place], [place], [address] ?Phone: [phone]   Fax:   [phone] ? ?Name: Katja Blue ?MRN: [id number] ?Date of Birth: 02-Oct-1986 ? ? ? ?

## 2022-03-14 ENCOUNTER — Ambulatory Visit: Payer: BC Managed Care – PPO | Admitting: Physical Therapy

## 2022-03-14 DIAGNOSIS — R29818 Other symptoms and signs involving the nervous system: Secondary | ICD-10-CM | POA: Diagnosis not present

## 2022-03-14 DIAGNOSIS — R29898 Other symptoms and signs involving the musculoskeletal system: Secondary | ICD-10-CM | POA: Diagnosis not present

## 2022-03-14 DIAGNOSIS — M6281 Muscle weakness (generalized): Secondary | ICD-10-CM | POA: Diagnosis not present

## 2022-03-14 NOTE — Therapy (Signed)
[place] ?[place] [address] ?[place], [place], [address] ?Phone: [phone]   Fax:  [phone] ? ?Physical Therapy Treatment ? ?Patient Details  ?Name: [name] ?MRN: [id number] ?Date of Birth: 1986/01/27 ?Referring Provider (PT): Freischlag, Almyra Free ? ? ?Encounter Date: 03/14/2022 ? ? PT End of Session - 03/14/22 0926   ? ? Visit Number 14   ? Number of Visits 20   ? Date for PT Re-Evaluation 04/07/22   ? PT Start Time 0845   ? PT Stop Time 0926   ? PT Time Calculation (min) 41 min   ? Activity Tolerance Patient tolerated treatment well   ? Behavior During Therapy [facility] for tasks assessed/performed   ? ?  ?  ? ?  ? ? ?Past Medical History:  ?Diagnosis Date  ? 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 GERD (gastroesophageal reflux disease)   ? Vitreous detachment of right eye   ? ? ?Past Surgical History:  ?Procedure Laterality Date  ? BREAST BIOPSY Left 04/2017  ? WISDOM TOOTH EXTRACTION  2004  ? x4  ? ? ?There were no vitals filed for this visit. ? ? Subjective Assessment - 03/14/22 0848   ? ? Subjective I'm doing good, better than last time.   ? Patient Stated Goals manage her fatigue in her RUE, postural awareness and workspace management, be able to write without pain and fatigue.   ? Currently in Pain? No/denies   ? ?  ?  ? ?  ? ? ? ? ? ? ? ? ? ? ? ? ? ? ? ? ? ? ? ? [facility] Adult PT Treatment/Exercise - 03/14/22 0001   ? ?  ? Shoulder Exercises: Seated  ? Other Seated Exercises UBE lvl 4 x 6 mins; 3 mins fwd/ 3 mins backwards   ?  ? Shoulder Exercises: Prone  ? Other Prone Exercises plank on bosu ball 30 secs; planks 20 secs x2   ? Other Prone Exercises commandos 2x10   ?  ? Shoulder Exercises: Standing  ? External Rotation AROM;Strengthening;Both;20 reps   ? External Rotation Weight (lbs) 3#   ? Other Standing Exercises overhead 5# dumbells 2 laps inside building   ? Other Standing Exercises shoulder press 1x10 w/ 3#, 1x10 w/ 4#; overhead flex w/ trunk ext 2x10 yellow  weight ball   ?  ? Shoulder Exercises: Power Tower  ? Extension 20 reps   ? Extension Limitations 15#   ? Row 20 reps   ? Row Limitations 10# each arm   ? Other Power Engineer, water dead hangs 20 secs x3   ? Other Power Engineer, water lat pull downs 2x10 10#   ? ?  ?  ? ?  ? ? ? ? ? ? ? ? ? ? ? ? PT Short Term Goals - 02/07/22 0844   ? ?  ? PT SHORT TERM GOAL #1  ? Title I with basic HEP   ? Status Achieved   ? ?  ?  ? ?  ? ? ? ? PT Long Term Goals - 03/14/22 0927   ? ?  ? PT LONG TERM GOAL #2  ? Title Patietn will demonstrate BUE and trunk strength of at least 4/5 to improve postural control.   ? Baseline 3+-(4-)/5   ? Time 10   ? Period Weeks   ? Status Partially Met   ? Target Date 04/07/22   ?  ? PT LONG TERM GOAL #3  ?  Title Patient will test (-) in RUE neural tension test, with no tingling.   ? Baseline (+)   ? Time 10   ? Period Weeks   ? Status Partially Met   ? Target Date 04/07/22   ?  ? PT LONG TERM GOAL #4  ? Title Patient will tolerate a full day of work without C/O exhaustion or R upper quadrant fatigue.   ? Baseline Currently exhausted at the end of work day.   ? Time 10   ? Period Weeks   ? Status Partially Met   ? Target Date 04/07/22   ?  ? PT LONG TERM GOAL #5  ? Title Paitent will be able to writre with her R hand x at least 30 minutes without fatigue or tingling.   ? Baseline Less than 5 minutes.   ? Time 10   ? Period Weeks   ? Status On-going   ? Target Date 04/07/22   ? ?  ?  ? ?  ? ? ? ? ? ? ? ? Plan - 03/14/22 0929   ? ? Clinical Impression Statement Patient came in doing well. Focused on strengthening core and shoulders for today's session. Vc's needed for correct posture with commandos. Attempted planks on bosu ball but switched to regular planks due to shaking.   ? Personal Factors and Comorbidities Fitness;Profession   ? Examination-Activity Limitations Bathing;Reach Overhead;Carry;Dressing;Lift   ? 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  Stability/Clinical Decision Making Evolving/Moderate complexity   ? Clinical Decision Making Moderate   ? Rehab Potential Good   ? PT Frequency 2x / week   ? PT Duration Other (comment)   ? PT Treatment/Interventions ADLs/Self Care Home Management;Electrical Stimulation;Cryotherapy;Iontophoresis 40m/ml Dexamethasone;Moist Heat;Neuromuscular re-education;Manual techniques;Therapeutic exercise;Therapeutic activities;Functional mobility training;Passive range of motion;Dry needling;Vasopneumatic Device;Patient/family education   ? PT Next Visit Plan focused treatment on the HA issues see how she is doing   ? Consulted and Agree with Plan of Care Patient   ? ?  ?  ? ?  ? ? ?Patient will benefit from skilled therapeutic intervention in order to improve the following deficits and impairments:  Decreased coordination, Decreased range of motion, Increased fascial restricitons, Pain, Decreased strength, Postural dysfunction, Improper body mechanics, Impaired flexibility, Hypomobility, Decreased mobility ? ?Visit Diagnosis: ?Other symptoms and signs involving the nervous system ? ?Muscle weakness (generalized) ? ?Other symptoms and signs involving the musculoskeletal system ? ? ? ? ?Problem List ?Patient Active Problem List  ? Diagnosis Date Noted  ? Right hand weakness 06/05/2021  ? Numbness and tingling in right hand 06/05/2021  ? Migraines 03/26/2016  ? ? ?Shametra Cumberland PChauncey Cruel?03/14/2022, 9:39 AM ? ?[place] [address] ?[place] [address]?Phone: [phone]  Fax:  [phone]? ?Name: BBeverlie Kurihara?MRN: [id number]?Date of Birth: [birth year]? ? ? ?

## 2022-03-19 ENCOUNTER — Ambulatory Visit: Payer: BC Managed Care – PPO | Admitting: Physical Therapy

## 2022-03-19 ENCOUNTER — Encounter: Payer: Self-pay | Admitting: Physical Therapy

## 2022-03-19 DIAGNOSIS — R29818 Other symptoms and signs involving the nervous system: Secondary | ICD-10-CM | POA: Diagnosis not present

## 2022-03-19 DIAGNOSIS — R29898 Other symptoms and signs involving the musculoskeletal system: Secondary | ICD-10-CM

## 2022-03-19 DIAGNOSIS — M6281 Muscle weakness (generalized): Secondary | ICD-10-CM | POA: Diagnosis not present

## 2022-03-19 NOTE — Therapy (Signed)
[place] ?[place] ?[address] ?[place], [place], [address] ?Phone: [phone]   Fax:  [phone] ? ?Physical Therapy Treatment ? ?Patient Details  ?Name: [name] ?MRN: [id number] ?Date of Birth: February 23, 1986 ?Referring Provider (PT): Freischlag, Almyra Free ? ? ?Encounter Date: 03/19/2022 ? ? PT End of Session - 03/19/22 8101   ? ? Visit Number 15   ? Date for PT Re-Evaluation 04/07/22   ? PT Start Time 0840   ? PT Stop Time 0925   ? PT Time Calculation (min) 45 min   ? Activity Tolerance Patient tolerated treatment well   ? Behavior During Therapy [facility] for tasks assessed/performed   ? ?  ?  ? ?  ? ? ?Past Medical History:  ?Diagnosis Date  ? 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 GERD (gastroesophageal reflux disease)   ? Vitreous detachment of right eye   ? ? ?Past Surgical History:  ?Procedure Laterality Date  ? BREAST BIOPSY Left 04/2017  ? WISDOM TOOTH EXTRACTION  2004  ? x4  ? ? ?There were no vitals filed for this visit. ? ? Subjective Assessment - 03/19/22 0848   ? ? Subjective I was pretty sore in the ribs last time   ? Currently in Pain? No/denies   ? ?  ?  ? ?  ? ? ? ? ? ? ? ? ? ? ? ? ? ? ? ? ? ? ? ? [facility] Adult PT Treatment/Exercise - 03/19/22 0001   ? ?  ? Shoulder Exercises: Supine  ? Other Supine Exercises feet on ball K2C, bridges and isometrica abs   ?  ? Shoulder Exercises: Standing  ? Other Standing Exercises 20# farmer carry at side, 5# overhead carry   ?  ? Shoulder Exercises: ROM/Strengthening  ? UBE (Upper Arm Bike) UBE lvl 2.5 x 6 mins; 3 mins fwd and 3 mins backward   ? Lat Pull Limitations 20# 2x10   ? Cybex Row Limitations 20# 2x10   ? Other ROM/Strengthening Exercises 5# face pulls 2x10   ? Other ROM/Strengthening Exercises 10# straight arm pulls cues for posture   ?  ? Shoulder Exercises: Stretch  ? Corner Stretch 3 reps;10 seconds   ? Other Shoulder Stretches wrist flexiona nd extension stretches   ? Other Shoulder Stretches passive shoulder  depression with cervical rotation stretches   ? ?  ?  ? ?  ? ? ? ? ? ? ? ? ? ? ? ? PT Short Term Goals - 02/07/22 0844   ? ?  ? PT SHORT TERM GOAL #1  ? Title I with basic HEP   ? Status Achieved   ? ?  ?  ? ?  ? ? ? ? PT Long Term Goals - 03/14/22 0927   ? ?  ? PT LONG TERM GOAL #2  ? Title Patietn will demonstrate BUE and trunk strength of at least 4/5 to improve postural control.   ? Baseline 3+-(4-)/5   ? Time 10   ? Period Weeks   ? Status Partially Met   ? Target Date 04/07/22   ?  ? PT LONG TERM GOAL #3  ? Title Patient will test (-) in RUE neural tension test, with no tingling.   ? Baseline (+)   ? Time 10   ? Period Weeks   ? Status Partially Met   ? Target Date 04/07/22   ?  ? PT LONG TERM GOAL #4  ? Title Patient  will tolerate a full day of work without C/O exhaustion or R upper quadrant fatigue.   ? Baseline Currently exhausted at the end of work day.   ? Time 10   ? Period Weeks   ? Status Partially Met   ? Target Date 04/07/22   ?  ? PT LONG TERM GOAL #5  ? Title Paitent will be able to writre with her R hand x at least 30 minutes without fatigue or tingling.   ? Baseline Less than 5 minutes.   ? Time 10   ? Period Weeks   ? Status On-going   ? Target Date 04/07/22   ? ?  ?  ? ?  ? ? ? ? ? ? ? ? Plan - 03/19/22 0928   ? ? Clinical Impression Statement Overall doing better, was very sore after the last treatment but no issues, she is holding posture better, having less neck pain.  Still some right UE neural tension   ? PT Next Visit Plan see how she is doing, will look to progress to advanced HEP over the next two weeks   ? Consulted and Agree with Plan of Care Patient   ? ?  ?  ? ?  ? ? ?Patient will benefit from skilled therapeutic intervention in order to improve the following deficits and impairments:  Decreased coordination, Decreased range of motion, Increased fascial restricitons, Pain, Decreased strength, Postural dysfunction, Improper body mechanics, Impaired flexibility, Hypomobility,  Decreased mobility ? ?Visit Diagnosis: ?Muscle weakness (generalized) ? ?Other symptoms and signs involving the musculoskeletal system ? ? ? ? ?Problem List ?Patient Active Problem List  ? Diagnosis Date Noted  ? Right hand weakness 06/05/2021  ? Numbness and tingling in right hand 06/05/2021  ? Migraines 03/26/2016  ? ? Sumner Boast, PT ?03/19/2022, 10:30 AM ? ?[place] ?[place] ?[address] ?[place], [place], [address] ?Phone: [phone]   Fax:  [phone] ? ?Name: [name] ?MRN: [id number] ?Date of Birth: [birth year] ? ? ? ?

## 2022-03-21 ENCOUNTER — Encounter: Payer: Self-pay | Admitting: Physical Therapy

## 2022-03-21 ENCOUNTER — Ambulatory Visit: Payer: BC Managed Care – PPO | Admitting: Physical Therapy

## 2022-03-21 DIAGNOSIS — R29818 Other symptoms and signs involving the nervous system: Secondary | ICD-10-CM | POA: Diagnosis not present

## 2022-03-21 DIAGNOSIS — R29898 Other symptoms and signs involving the musculoskeletal system: Secondary | ICD-10-CM | POA: Diagnosis not present

## 2022-03-21 DIAGNOSIS — M6281 Muscle weakness (generalized): Secondary | ICD-10-CM

## 2022-03-21 NOTE — Therapy (Signed)
[place] ?[place] ?[address] ?[place], [place], [address] ?Phone: [phone]   Fax:  [phone] ? ?Physical Therapy Treatment ? ?Patient Details  ?Name: [name] ?MRN: [id number] ?Date of Birth: 01/14/1986 ?Referring Provider (PT): Freischlag, Almyra Free ? ? ?Encounter Date: 03/21/2022 ? ? PT End of Session - 03/21/22 3825   ? ? Visit Number 16   ? Date for PT Re-Evaluation 04/07/22   ? PT Start Time 0845   ? PT Stop Time [phone]   ? PT Time Calculation (min) 43 min   ? Activity Tolerance Patient tolerated treatment well   ? Behavior During Therapy [facility] for tasks assessed/performed   ? ?  ?  ? ?  ? ? ?Past Medical History:  ?Diagnosis Date  ? 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 GERD (gastroesophageal reflux disease)   ? Vitreous detachment of right eye   ? ? ?Past Surgical History:  ?Procedure Laterality Date  ? BREAST BIOPSY Left 04/2017  ? WISDOM TOOTH EXTRACTION  2004  ? x4  ? ? ?There were no vitals filed for this visit. ? ? Subjective Assessment - 03/21/22 0848   ? ? Subjective "Im good"   ? Currently in Pain? No/denies   ? ?  ?  ? ?  ? ? ? ? ? ? ? ? ? ? ? ? ? ? ? ? ? ? ? ? [facility] Adult PT Treatment/Exercise - 03/21/22 0001   ? ?  ? Shoulder Exercises: Seated  ? Other Seated Exercises Bent over rev flys, row, ext  3lb 2x10   ?  ? Shoulder Exercises: Standing  ? Horizontal ABduction Both;20 reps;Theraband   ? Theraband Level (Shoulder Horizontal ABduction) Level 3 (Green)   ? Other Standing Exercises Tricep Ext 25lb 2x15; Shoulder ER red 2x15   ? Other Standing Exercises 20# farmer carry at side   ?  ? Shoulder Exercises: ROM/Strengthening  ? UBE (Upper Arm Bike) UBE lvl 3 x 6 mins; 3 mins fwd and 3 mins backward   ? Lat Pull Limitations 25# 2x10   ? Cybex Row Limitations 25# 2x10   ?  ? Shoulder Exercises: Stretch  ? Corner Stretch 3 reps;10 seconds   ? ?  ?  ? ?  ? ? ? ? ? ? ? ? ? ? ? ? PT Short Term Goals - 02/07/22 0844   ? ?  ? PT SHORT TERM GOAL #1  ? Title I with  basic HEP   ? Status Achieved   ? ?  ?  ? ?  ? ? ? ? PT Long Term Goals - 03/21/22 0929   ? ?  ? PT LONG TERM GOAL #2  ? Title Patietn will demonstrate BUE and trunk strength of at least 4/5 to improve postural control.   ? Status Partially Met   ?  ? PT LONG TERM GOAL #3  ? Title Patient will test (-) in RUE neural tension test, with no tingling.   ? Status Partially Met   ?  ? PT LONG TERM GOAL #4  ? Title Patient will tolerate a full day of work without C/O exhaustion or R upper quadrant fatigue.   ? Status Partially Met   ? ?  ?  ? ?  ? ? ? ? ? ? ? ? Plan - 03/21/22 0929   ? ? Clinical Impression Statement Pt continues to do better overall, some postural weakness present with farmers carry. UR fatigue and  burning reported with all bent over exercises. No reports of increase pan. She continues to have come neural tension in RUE.   ? Personal Factors and Comorbidities Fitness;Profession   ? Examination-Activity Limitations Bathing;Reach Overhead;Carry;Dressing;Lift   ? 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 Stability/Clinical Decision Making Evolving/Moderate complexity   ? Rehab Potential Good   ? PT Frequency 2x / week   ? PT Treatment/Interventions ADLs/Self Care Home Management;Electrical Stimulation;Cryotherapy;Iontophoresis 44m/ml Dexamethasone;Moist Heat;Neuromuscular re-education;Manual techniques;Therapeutic exercise;Therapeutic activities;Functional mobility training;Passive range of motion;Dry needling;Vasopneumatic Device;Patient/family education   ? PT Next Visit Plan see how she is doing, will look to progress to advanced HEP over the next two weeks   ? ?  ?  ? ?  ? ? ?Patient will benefit from skilled therapeutic intervention in order to improve the following deficits and impairments:  Decreased coordination, Decreased range of motion, Increased fascial restricitons, Pain, Decreased strength, Postural dysfunction, Improper body mechanics, Impaired  flexibility, Hypomobility, Decreased mobility ? ?Visit Diagnosis: ?Muscle weakness (generalized) ? ?Other symptoms and signs involving the nervous system ? ?Other symptoms and signs involving the musculoskeletal system ? ? ? ? ?Problem List ?Patient Active Problem List  ? Diagnosis Date Noted  ? Right hand weakness 06/05/2021  ? Numbness and tingling in right hand 06/05/2021  ? Migraines 03/26/2016  ? ? ?RScot Jun PTA ?03/21/2022, 9:31 AM ? ?[place] ?[address] ?[place] [address]?Phone: [phone]  Fax:  [phone]? ?Name: [name]?MRN: [id number]?Date of Birth: 5July 17, 1987? ? ? ?

## 2022-03-24 ENCOUNTER — Encounter: Payer: Self-pay | Admitting: Physical Therapy

## 2022-03-24 ENCOUNTER — Ambulatory Visit: Payer: BC Managed Care – PPO | Admitting: Physical Therapy

## 2022-03-24 DIAGNOSIS — R29818 Other symptoms and signs involving the nervous system: Secondary | ICD-10-CM

## 2022-03-24 DIAGNOSIS — M6281 Muscle weakness (generalized): Secondary | ICD-10-CM | POA: Diagnosis not present

## 2022-03-24 DIAGNOSIS — R29898 Other symptoms and signs involving the musculoskeletal system: Secondary | ICD-10-CM

## 2022-03-24 NOTE — Therapy (Signed)
?[place] ?[address] ?[place], [place], [address] ?Phone: [phone]   Fax:  [phone] ? ?Physical Therapy Treatment ? ?Patient Details  ?Name: [name] ?MRN: [id number] ?Date of Birth: Aug 13, 1986 ?Referring Provider (PT): Freischlag, Almyra Free ? ? ?Encounter Date: 03/24/2022 ? ? PT End of Session - 03/24/22 0926   ? ? Visit Number 17   ? Date for PT Re-Evaluation 04/07/22   ? PT Start Time 0845   ? PT Stop Time [phone]   ? PT Time Calculation (min) 43 min   ? ?  ?  ? ?  ? ? ?Past Medical History:  ?Diagnosis Date  ? 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 GERD (gastroesophageal reflux disease)   ? Vitreous detachment of right eye   ? ? ?Past Surgical History:  ?Procedure Laterality Date  ? BREAST BIOPSY Left 04/2017  ? WISDOM TOOTH EXTRACTION  2004  ? x4  ? ? ?There were no vitals filed for this visit. ? ? Subjective Assessment - 03/24/22 0849   ? ? Subjective OK, wrist and arm are kinda tires, from cleaning yesterday   ? Currently in Pain? No/denies   ? ?  ?  ? ?  ? ? ? ? ? ? ? ? ? ? ? ? ? ? ? ? ? ? ? ? [facility] Adult PT Treatment/Exercise - 03/24/22 0001   ? ?  ? Shoulder Exercises: Seated  ? Other Seated Exercises Sit to stand w/ 9lb OHP 2x10   ?  ? Shoulder Exercises: Standing  ? Horizontal ABduction Both;20 reps;Theraband   ? Theraband Level (Shoulder Horizontal ABduction) Level 4 (Blue)   ? External Rotation Strengthening;20 reps;Theraband   ? Theraband Level (Shoulder External Rotation) Level 4 (Blue)   ? Flexion Strengthening;Both;20 reps;Theraband   ? Shoulder Flexion Weight (lbs) 3   ? ABduction Strengthening;Both;20 reps;Theraband   ? Shoulder ABduction Weight (lbs) 3   ?  ? Shoulder Exercises: ROM/Strengthening  ? UBE (Upper Arm Bike) UBE lvl 3 x 6 mins; 3 mins fwd and 3 mins backward   ? Lat Pull Limitations 25# 2x12   ? Cybex Row Limitations 25# 2x12   ? Other ROM/Strengthening Exercises 5# face pulls 2x10   ? ?  ?  ? ?  ? ? ? ? ? ? ? ? ? ? ? ? PT Short  Term Goals - 02/07/22 0844   ? ?  ? PT SHORT TERM GOAL #1  ? Title I with basic HEP   ? Status Achieved   ? ?  ?  ? ?  ? ? ? ? PT Long Term Goals - 03/21/22 0929   ? ?  ? PT LONG TERM GOAL #2  ? Title Patietn will demonstrate BUE and trunk strength of at least 4/5 to improve postural control.   ? Status Partially Met   ?  ? PT LONG TERM GOAL #3  ? Title Patient will test (-) in RUE neural tension test, with no tingling.   ? Status Partially Met   ?  ? PT LONG TERM GOAL #4  ? Title Patient will tolerate a full day of work without C/O exhaustion or R upper quadrant fatigue.   ? Status Partially Met   ? ?  ?  ? ?  ? ? ? ? ? ? ? ? Plan - 03/24/22 0928   ? ? Clinical Impression Statement No issues completing today's interventions. Pt able to tolerate increase resistance with seated  ER and horizontal abduction. Increase fatigue reported with sit to stands. Postural cue required with seated rows. Cue to engage core needed with face pulls.   ? Examination-Activity Limitations Bathing;Reach Overhead;Carry;Dressing;Lift   ? 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 Stability/Clinical Decision Making Evolving/Moderate complexity   ? Rehab Potential Good   ? PT Frequency 2x / week   ? PT Treatment/Interventions ADLs/Self Care Home Management;Electrical Stimulation;Cryotherapy;Iontophoresis 43m/ml Dexamethasone;Moist Heat;Neuromuscular re-education;Manual techniques;Therapeutic exercise;Therapeutic activities;Functional mobility training;Passive range of motion;Dry needling;Vasopneumatic Device;Patient/family education   ? PT Next Visit Plan see how she is doing, will look to progress to advanced HEP over the next two weeks   ? ?  ?  ? ?  ? ? ?Patient will benefit from skilled therapeutic intervention in order to improve the following deficits and impairments:  Decreased coordination, Decreased range of motion, Increased fascial restricitons, Pain, Decreased strength, Postural  dysfunction, Improper body mechanics, Impaired flexibility, Hypomobility, Decreased mobility ? ?Visit Diagnosis: ?Muscle weakness (generalized) ? ?Other symptoms and signs involving the nervous system ? ?Other symptoms and signs involving the musculoskeletal system ? ? ? ? ?Problem List ?Patient Active Problem List  ? Diagnosis Date Noted  ? Right hand weakness 06/05/2021  ? Numbness and tingling in right hand 06/05/2021  ? Migraines 03/26/2016  ? ? ?RScot Jun PTA ?03/24/2022, 9:29 AM ? ?[place] ?[address] ?[place] [address]?Phone: [phone]  Fax:  [phone]? ?Name: BNatalina Wieting?MRN: [id number]?Date of Birth: [birth year]? ? ? ?

## 2022-03-27 ENCOUNTER — Encounter: Payer: Self-pay | Admitting: Physical Therapy

## 2022-03-27 ENCOUNTER — Ambulatory Visit: Payer: BC Managed Care – PPO | Admitting: Physical Therapy

## 2022-03-27 DIAGNOSIS — M6281 Muscle weakness (generalized): Secondary | ICD-10-CM

## 2022-03-27 DIAGNOSIS — R29818 Other symptoms and signs involving the nervous system: Secondary | ICD-10-CM

## 2022-03-27 DIAGNOSIS — R29898 Other symptoms and signs involving the musculoskeletal system: Secondary | ICD-10-CM | POA: Diagnosis not present

## 2022-03-27 NOTE — Therapy (Signed)
[place] [place], [place], [address] Phone: [phone]   Fax:  [phone]  Physical Therapy Treatment  Patient Details  Name: [name] MRN: [id number] Date of Birth: 12-Aug-1986 Referring Provider (PT): Irven [place]   Encounter Date: 03/27/2022   PT End of Session - 03/27/22 0924     Visit Number 18    Date for PT Re-Evaluation 04/07/22    PT Start Time 0845    PT Stop Time 0930    PT Time Calculation (min) 45 min    Activity Tolerance Patient tolerated treatment well    Behavior During Therapy [facility] for tasks assessed/performed             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   Subjective Assessment - 03/27/22 0843     Subjective Doing all right, Right hand feels weak    Currently in Pain? No/denies                               [facility] Adult PT Treatment/Exercise - 03/27/22 0001       Shoulder Exercises: Seated   Other Seated Exercises Sit to stand w/ 9lb OHP 2x10      Shoulder Exercises: Standing   Flexion Strengthening;Both;20 reps;Theraband    Shoulder Flexion Weight (lbs) 3    ABduction Strengthening;Both;20 reps;Theraband    Shoulder ABduction Weight (lbs) 3    Other Standing Exercises Tricep Ext 25lb 2x10      Shoulder Exercises: ROM/Strengthening   UBE (Upper Arm Bike) UBE lvl 3 x 6 mins; 3 mins fwd and 3 mins backward    Lat Pull Limitations 25# 2x15    Cybex Row Limitations 25# 2x15    Other ROM/Strengthening Exercises 10# face pulls 2x10    Other ROM/Strengthening Exercises Shoulder Ext 5lb 2x10                       PT Short Term Goals - 02/07/22 0844        PT SHORT TERM GOAL #1   Title I with basic HEP    Status Achieved               PT Long Term Goals - 03/21/22 0929       PT LONG TERM GOAL #2   Title Patietn will demonstrate BUE and trunk strength of at least 4/5 to improve postural control.    Status Partially Met      PT LONG TERM GOAL #3   Title Patient will test (-) in RUE neural tension test, with no tingling.    Status Partially Met      PT LONG TERM GOAL #4   Title Patient will tolerate a full day of work without C/O exhaustion or R upper quadrant fatigue.    Status Partially Met                   Plan - 03/27/22 0925     Clinical Impression Statement Pt enters with some report of R hand weakness but tat resolved as  session progressed. No reports of increase pain during session. Bilateral UE shaking noted with flexion and abduction. Tactile cue needed with triceps Ext to keep arms to side. increase reps tolerated with seated rows. Pt is progressing well overall.    Personal Factors and Comorbidities Fitness;Profession    Examination-Activity Limitations Bathing;Reach Overhead;Carry;Dressing;Lift    Examination-Participation Restrictions Meal Prep;Cleaning;Occupation;Yard Work    PT Frequency 2x / week    PT Treatment/Interventions ADLs/Self Care Home Management;Electrical Stimulation;Cryotherapy;Iontophoresis 20m/ml Dexamethasone;Moist Heat;Neuromuscular re-education;Manual techniques;Therapeutic exercise;Therapeutic activities;Functional mobility training;Passive range of motion;Dry needling;Vasopneumatic Device;Patient/family education    PT Next Visit Plan see how she is doing, postural strenght,  will look to progress to advanced HEP over the next two weeks             Patient will benefit from skilled therapeutic intervention in order to improve the following deficits and impairments:  Decreased coordination, Decreased range of motion, Increased fascial restricitons, Pain, Decreased strength,  Postural dysfunction, Improper body mechanics, Impaired flexibility, Hypomobility, Decreased mobility  Visit Diagnosis: Muscle weakness (generalized)  Other symptoms and signs involving the musculoskeletal system  Other symptoms and signs involving the nervous system     Problem List Patient Active Problem List   Diagnosis Date Noted   Right hand weakness 06/05/2021   Numbness and tingling in right hand 06/05/2021   Migraines 03/26/2016    RScot Jun PTA 03/27/2022, 9:27 AM  [place] [address]Phone: [phone]  Fax:  [phone] Name: Laura QuirosMRN: [id number] of Birth: 5Feb 13, 1987

## 2022-03-31 ENCOUNTER — Ambulatory Visit: Payer: BC Managed Care – PPO | Admitting: Physical Therapy

## 2022-03-31 ENCOUNTER — Encounter: Payer: Self-pay | Admitting: Physical Therapy

## 2022-03-31 DIAGNOSIS — R29818 Other symptoms and signs involving the nervous system: Secondary | ICD-10-CM | POA: Diagnosis not present

## 2022-03-31 DIAGNOSIS — R29898 Other symptoms and signs involving the musculoskeletal system: Secondary | ICD-10-CM | POA: Diagnosis not present

## 2022-03-31 DIAGNOSIS — M6281 Muscle weakness (generalized): Secondary | ICD-10-CM | POA: Diagnosis not present

## 2022-03-31 NOTE — Therapy (Signed)
[address] [place], [place], [address] Phone: [phone]   Fax:  [phone]  Physical Therapy Treatment  Patient Details  Name: Shanece Cochrane MRN: [id number] Date of Birth: 04-06-1986 Referring Provider (PT): Irven Baltimore   Encounter Date: 03/31/2022   PT End of Session - 03/31/22 0926     Visit Number 19    Date for PT Re-Evaluation 04/07/22    PT Start Time 0845    PT Stop Time 0928    PT Time Calculation (min) 43 min    Activity Tolerance Patient tolerated treatment well    Behavior During Therapy [facility] for tasks assessed/performed             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   Subjective Assessment - 03/31/22 0850     Subjective Pt report that her R forearm feel weak. Pt reports having a very busy weekend    Currently in Pain? No/denies                               [facility] Adult PT Treatment/Exercise - 03/31/22 0001       Shoulder Exercises: Seated   Other Seated Exercises Sit to stand w/ blue ball OHP 2x10      Shoulder Exercises: Standing   Horizontal ABduction Both;Theraband;Strengthening;15 reps   x2   Theraband Level (Shoulder Horizontal ABduction) Level 3 (Green)    External Rotation Strengthening;20 reps;Theraband    Theraband Level (Shoulder External Rotation) Level 3 (Green)    Flexion Strengthening;Both;20 reps;Theraband    Shoulder Flexion Weight (lbs) 3    Extension Strengthening;Both;20 reps;Weights    Extension Weight (lbs) 10    Row Strengthening;Both;20 reps;Weights    Row Weight (lbs) 15    Other Standing Exercises Tricep Ext 25lb 2x10      Shoulder Exercises:  ROM/Strengthening   UBE (Upper Arm Bike) UBE lvl 3 x 6 mins; 3 mins fwd and 3 mins backward    Lat Pull Limitations 35# 2x10    Cybex Row Limitations 35# 2x10    Other ROM/Strengthening Exercises Shoulder Ext 10lb 2x10                       PT Short Term Goals - 02/07/22 0844       PT SHORT TERM GOAL #1   Title I with basic HEP    Status Achieved               PT Long Term Goals - 03/21/22 0929       PT LONG TERM GOAL #2   Title Patietn will demonstrate BUE and trunk strength of at least 4/5 to improve postural control.    Status Partially Met      PT LONG TERM GOAL #3   Title Patient will test (-) in RUE neural tension test, with no tingling.    Status Partially Met      PT LONG TERM GOAL #4   Title Patient will tolerate a full day of work without C/O exhaustion or R upper quadrant  fatigue.    Status Partially Met                   Plan - 03/31/22 0927     Clinical Impression Statement Pt reports working a yard sale this weekend being busy. She enters reporting some R forearm weakness and fatigue. Despite subjective reports she did well during today session. A progression with weight did cause some muscle fatigue and shaking. Postural  core weakness noted with shoulder extensions. no reports of pain.    Personal Factors and Comorbidities Fitness;Profession    Examination-Activity Limitations Bathing;Reach Overhead;Carry;Dressing;Lift    Examination-Participation Restrictions Meal Prep;Cleaning;Occupation;Yard Work    Stability/Clinical Decision Making Evolving/Moderate complexity    Rehab Potential Good    PT Frequency 2x / week    PT Treatment/Interventions ADLs/Self Care Home Management;Electrical Stimulation;Cryotherapy;Iontophoresis 48m/ml Dexamethasone;Moist Heat;Neuromuscular re-education;Manual techniques;Therapeutic exercise;Therapeutic activities;Functional mobility training;Passive range of motion;Dry needling;Vasopneumatic  Device;Patient/family education    PT Next Visit Plan see how she is doing, postural strenght             Patient will benefit from skilled therapeutic intervention in order to improve the following deficits and impairments:  Decreased coordination, Decreased range of motion, Increased fascial restricitons, Pain, Decreased strength, Postural dysfunction, Improper body mechanics, Impaired flexibility, Hypomobility, Decreased mobility  Visit Diagnosis: Muscle weakness (generalized)  Other symptoms and signs involving the nervous system  Other symptoms and signs involving the musculoskeletal system     Problem List Patient Active Problem List   Diagnosis Date Noted   Right hand weakness 06/05/2021   Numbness and tingling in right hand 06/05/2021   Migraines 03/26/2016    RScot Jun PTA 03/31/2022, 9:33 AM  [place] [address]Phone: [phone]  Fax:  [phone] Name: BKima MalenfantMRN: [id number] of Birth: 5August 03, 1987

## 2022-04-02 ENCOUNTER — Ambulatory Visit: Payer: BC Managed Care – PPO | Admitting: Physical Therapy

## 2022-04-02 ENCOUNTER — Encounter: Payer: Self-pay | Admitting: Physical Therapy

## 2022-04-02 DIAGNOSIS — M6281 Muscle weakness (generalized): Secondary | ICD-10-CM | POA: Diagnosis not present

## 2022-04-02 DIAGNOSIS — R29898 Other symptoms and signs involving the musculoskeletal system: Secondary | ICD-10-CM | POA: Diagnosis not present

## 2022-04-02 DIAGNOSIS — R29818 Other symptoms and signs involving the nervous system: Secondary | ICD-10-CM | POA: Diagnosis not present

## 2022-04-02 NOTE — Therapy (Signed)
[address]. [place], [place], [address] Phone: [phone]   Fax:  [phone]  Physical Therapy Treatment  Patient Details  Name: [name] MRN: [id number] Date of Birth: 01/06/1986 Referring Provider (PT): [name]   Encounter Date: 04/02/2022   PT End of Session - 04/02/22 0920     Visit Number 20    Date for PT Re-Evaluation 04/07/22    PT Start Time 0845    PT Stop Time 0930    PT Time Calculation (min) 45 min    Activity Tolerance Patient tolerated treatment well    Behavior During Therapy [facility] for tasks assessed/performed             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   Subjective Assessment - 04/02/22 0843     Subjective Good, arm is still really tired.    Currently in Pain? No/denies                               [facility] Adult PT Treatment/Exercise - 04/02/22 0001       Shoulder Exercises: Seated   Other Seated Exercises Sit to stand w/ yellow ball OHP 2x10      Shoulder Exercises: Standing   Horizontal ABduction Both;Theraband;Strengthening;20 reps    Theraband Level (Shoulder Horizontal ABduction) Level 3 (Green)    ABduction Strengthening;Both;20 reps;Theraband    Shoulder ABduction Weight (lbs) 2    Extension Strengthening;Both;20 reps;Weights    Extension Weight (lbs) 10    Other Standing Exercises RUE ER red abducted to 90 2x10      Shoulder Exercises: ROM/Strengthening   UBE (Upper Arm Bike) UBE lvl 2 x 6 mins; 3 mins fwd and 3 mins backward    Lat Pull Limitations 25# 2x10    Cybex Row Limitations 25# 2x10      Shoulder Exercises: Stretch   Other Shoulder  Stretches wrist flexion and extension stretches                       PT Short Term Goals - 02/07/22 0844       PT SHORT TERM GOAL #1   Title I with basic HEP    Status Achieved               PT Long Term Goals - 04/02/22 4970       PT LONG TERM GOAL #1   Title I with final HEP    Status Achieved      PT LONG TERM GOAL #2   Title Patietn will demonstrate BUE and trunk strength of at least 4/5 to improve postural control.    Status Partially Met      PT LONG TERM GOAL #3   Title Patient will test (-) in RUE neural tension test, with no tingling.    Status Partially Met      PT LONG TERM GOAL #4   Title Patient will tolerate a full day of work without C/O exhaustion or R upper quadrant fatigue.      PT LONG TERM  GOAL #5   Title Paitent will be able to writre with her R hand x at least 30 minutes without fatigue or tingling.    Status Partially Met                   Plan - 04/02/22 0927     Clinical Impression Statement Pt has progressed overall reporting decrease neck and shoulder pain. Some R forearm tightness and fatigue and tightness since having a yard sale. Some UE fatigue noted today with external rotation and extensions. She reports no functional limitation at home. PT stated that she has some weights at home that she can work with.    Personal Factors and Comorbidities Fitness;Profession    Examination-Activity Limitations Bathing;Reach Overhead;Carry;Dressing;Lift    Examination-Participation Restrictions Meal Prep;Cleaning;Occupation;Yard Work    Stability/Clinical Decision Making Evolving/Moderate complexity    Rehab Potential Good    PT Frequency 2x / week    PT Treatment/Interventions ADLs/Self Care Home Management;Electrical Stimulation;Cryotherapy;Iontophoresis 46m/ml Dexamethasone;Moist Heat;Neuromuscular re-education;Manual techniques;Therapeutic exercise;Therapeutic activities;Functional mobility training;Passive range of  motion;Dry needling;Vasopneumatic Device;Patient/family education    PT Next Visit Plan D/c PT             Patient will benefit from skilled therapeutic intervention in order to improve the following deficits and impairments:  Decreased coordination, Decreased range of motion, Increased fascial restricitons, Pain, Decreased strength, Postural dysfunction, Improper body mechanics, Impaired flexibility, Hypomobility, Decreased mobility  Visit Diagnosis: Muscle weakness (generalized)  Other symptoms and signs involving the nervous system  Other symptoms and signs involving the musculoskeletal system     Problem List Patient Active Problem List   Diagnosis Date Noted   Right hand weakness 06/05/2021   Numbness and tingling in right hand 06/05/2021   Migraines 03/26/2016   PHYSICAL THERAPY DISCHARGE SUMMARY  Visits from Start of Care: 20   Patient agrees to discharge. Patient goals were partially met. Patient is being discharged due to being pleased with the current functional level.   RScot Rosales PTA 04/02/2022, 9:30 AM  [place] [address]Phone: [phone]  Fax:  [phone] Name: [name]: [id number] of Birth: [name]

## 2022-04-03 DIAGNOSIS — G540 Brachial plexus disorders: Secondary | ICD-10-CM | POA: Diagnosis not present

## 2022-04-16 DIAGNOSIS — Z13220 Encounter for screening for lipoid disorders: Secondary | ICD-10-CM | POA: Diagnosis not present

## 2022-04-16 DIAGNOSIS — Z0000 Encounter for general adult medical examination without abnormal findings: Secondary | ICD-10-CM | POA: Diagnosis not present

## 2022-04-23 DIAGNOSIS — H04123 Dry eye syndrome of bilateral lacrimal glands: Secondary | ICD-10-CM | POA: Diagnosis not present

## 2022-04-23 DIAGNOSIS — H16423 Pannus (corneal), bilateral: Secondary | ICD-10-CM | POA: Diagnosis not present

## 2022-04-23 DIAGNOSIS — H52223 Regular astigmatism, bilateral: Secondary | ICD-10-CM | POA: Diagnosis not present

## 2022-04-23 DIAGNOSIS — G43B0 Ophthalmoplegic migraine, not intractable: Secondary | ICD-10-CM | POA: Diagnosis not present

## 2022-04-23 DIAGNOSIS — H5213 Myopia, bilateral: Secondary | ICD-10-CM | POA: Diagnosis not present

## 2022-04-23 DIAGNOSIS — H40013 Open angle with borderline findings, low risk, bilateral: Secondary | ICD-10-CM | POA: Diagnosis not present

## 2022-05-14 ENCOUNTER — Encounter: Payer: Self-pay | Admitting: Family Medicine

## 2022-05-15 ENCOUNTER — Telehealth: Payer: Self-pay | Admitting: Neurology

## 2022-05-15 NOTE — Telephone Encounter (Signed)
PA submitted through CMM/[employer] KEY:[id number] Will await determination

## 2022-05-20 NOTE — Telephone Encounter (Signed)
PA approved for the pt  Effective from 05/15/2022 through 05/15/2023.

## 2022-10-06 DIAGNOSIS — Z6822 Body mass index (BMI) 22.0-22.9, adult: Secondary | ICD-10-CM | POA: Diagnosis not present

## 2022-10-06 DIAGNOSIS — Z01419 Encounter for gynecological examination (general) (routine) without abnormal findings: Secondary | ICD-10-CM | POA: Diagnosis not present

## 2022-10-30 DIAGNOSIS — R102 Pelvic and perineal pain: Secondary | ICD-10-CM | POA: Diagnosis not present

## 2022-12-24 ENCOUNTER — Encounter: Payer: Self-pay | Admitting: Family Medicine

## 2022-12-25 NOTE — Telephone Encounter (Signed)
Submitted PA Ajovy on covermymeds. Key: [id number]. Marked urgent. Waiting on determination from [place].

## 2022-12-30 NOTE — H&P (Signed)
[name], [name] MEDICAL RECORD NO: [id number] ACCOUNT NO: [id number] DATE OF BIRTH: 11-03-1986 PHYSICIAN: Darlyn Chamber, MD  History and Physical   DATE OF ADMISSION: 01/13/2023  The date of her surgery is 01/13/2023 at [facility] outpatient area.  HISTORY OF PRESENT ILLNESS:  The patient is a 37 year old nulligravid female who presents for diagnostic laparoscopy.  She is having heavy flow.  She has significant pain and discomfort during her cycle that radiates down the back and each leg.  She is  not sexually active.  Because of the pain and discomfort we did an ultrasound evaluation that was unremarkable.  She now presents for diagnostic laparoscopy to exclude such conditions as endometriosis.  ALLERGIES:  SHE IS ALLERGIC TO MELOXICAM AND SULFA.  MEDICATIONS:  She takes Zomig nasal spray as needed for migraines.  She is on Zofran tablets as needed for nausea.  She does AJOVY subcutaneous autoinjector every 30 days.  PAST MEDICAL HISTORY:  She has had usual childhood diseases without any significant sequelae.  She has no significant medical issues except for migraine headaches, anxiety and acne.  PAST SURGICAL HISTORY:  She has had one breast biopsy done.  SOCIAL HISTORY:  Reveals no tobacco, alcohol use.  FAMILY HISTORY:  Noncontributory.  REVIEW OF SYSTEMS:  Noncontributory.  PHYSICAL EXAMINATION:. VITAL SIGNS:  Afebrile, stable vital signs. HEENT:  Exam patient is normocephalic.  Pupils equal, round, reactive to light and accommodation.  Extraocular movements were intact.  Sclerae and conjunctivae are clear.  Oropharynx clear. NECK:  Without thyromegaly. BREASTS: No discrete masses. LUNGS:  Clear. CARDIOVASCULAR:  Regular rhythm and rate without murmurs or gallops. ABDOMEN:  Benign.  No mass, organomegaly, or tenderness. PELVIC:  Normal external genitalia.  Vaginal mucosa is clear.  Cervix unremarkable.  Uterus normal size, shape, and contour.  Adnexa free of masses or  tenderness. EXTREMITIES:  Trace edema. NEUROLOGIC:  Grossly normal limits.  ASSESSMENT:  Pelvic pain and dysmenorrhea, possibly secondary to endometriosis.  PLAN:  The patient to undergo diagnostic laparoscopy.  The risk of procedure have been discussed, this can include the risk of infection.  Risk of vascular injury that can lead to hemorrhage requiring transfusion with the risk of AIDS or hepatitis.   Excessive bleeding could require exploratory surgery.  There is a risk of injury to adjacent organs such as bowel, bladder, that could require further exploratory surgery.  Risk of deep venous thrombosis and pulmonary embolus.  The patient expressed  understanding of indications and risks.   PUS D: 12/30/2022 10:54:07 am T: 12/30/2022 11:09:00 am  JOB: [id number]

## 2022-12-30 NOTE — H&P (Signed)
Patient name   [name], [name] DICTATION# [id number]  Arvella Nigh, MD 12/30/2022 10:54 AM

## 2023-01-02 ENCOUNTER — Encounter (HOSPITAL_BASED_OUTPATIENT_CLINIC_OR_DEPARTMENT_OTHER): Payer: Self-pay | Admitting: Obstetrics and Gynecology

## 2023-01-08 ENCOUNTER — Encounter (HOSPITAL_BASED_OUTPATIENT_CLINIC_OR_DEPARTMENT_OTHER): Payer: Self-pay | Admitting: Obstetrics and Gynecology

## 2023-01-08 NOTE — Progress Notes (Signed)
Spoke w/ via phone for pre-op interview--- pt Lab needs dos----  urine preg (per anes)/  pre-op orders pending             Lab results------  no COVID test -----patient states asymptomatic no test needed Arrive at ------- 0530 on 01-13-2023 NPO after MN NO Solid Food.  Clear liquids from MN until--- 0430 Med rec completed Medications to take morning of surgery ----- prilosec, zomig if needed Diabetic medication ----- n/a Patient instructed no nail polish to be worn day of surgery Patient instructed to bring photo id and insurance card day of surgery Patient aware to have Driver (ride ) / caregiver    for 24 hours after surgery --- mother, jennifer Patient Special Instructions ----- n/a Pre-Op special Istructions ----- sent inbox message to dr Radene Knee in epic , requested orders Patient verbalized understanding of instructions that were given at this phone interview. Patient denies shortness of breath, chest pain, fever, cough at this phone interview.

## 2023-01-12 NOTE — Anesthesia Preprocedure Evaluation (Signed)
Anesthesia Evaluation  Patient identified by MRN, date of birth, ID band Patient awake    Reviewed: Allergy & Precautions, NPO status , Patient's Chart, lab work & pertinent test results  Airway Mallampati: II  TM Distance: >3 FB Neck ROM: Full    Dental no notable dental hx. (+) Teeth Intact, Dental Advisory Given   Pulmonary    Pulmonary exam normal breath sounds clear to auscultation       Cardiovascular Normal cardiovascular exam Rhythm:Regular Rate:Normal     Neuro/Psych  Headaches    GI/Hepatic ,GERD  ,,  Endo/Other    Renal/GU   Female GU complaint (Pelvic pain, dysmennorhea)     Musculoskeletal   Abdominal   Peds  Hematology   Anesthesia Other Findings All: Meloxicam, Sulfa  Reproductive/Obstetrics                             Anesthesia Physical Anesthesia Plan  ASA: 1  Anesthesia Plan: General   Post-op Pain Management: Tylenol PO (pre-op)* and Precedex   Induction: Intravenous  PONV Risk Score and Plan: 3 and Treatment may vary due to age or medical condition, Midazolam, Dexamethasone and Ondansetron  Airway Management Planned: Oral ETT  Additional Equipment: None  Intra-op Plan:   Post-operative Plan: Extubation in OR  Informed Consent:      Dental advisory given  Plan Discussed with:   Anesthesia Plan Comments:        Anesthesia Quick Evaluation

## 2023-01-13 ENCOUNTER — Encounter (HOSPITAL_BASED_OUTPATIENT_CLINIC_OR_DEPARTMENT_OTHER)
Admission: RE | Disposition: A | Discharge status: Home / Self Care | Payer: Self-pay | Source: Home / Self Care | Attending: Obstetrics and Gynecology

## 2023-01-13 ENCOUNTER — Other Ambulatory Visit: Payer: Self-pay

## 2023-01-13 ENCOUNTER — Ambulatory Visit (HOSPITAL_BASED_OUTPATIENT_CLINIC_OR_DEPARTMENT_OTHER): Payer: BC Managed Care – PPO | Admitting: Anesthesiology

## 2023-01-13 ENCOUNTER — Ambulatory Visit (HOSPITAL_BASED_OUTPATIENT_CLINIC_OR_DEPARTMENT_OTHER)
Admission: RE | Admit: 2023-01-13 | Discharge: 2023-01-13 | Disposition: A | Discharge status: Home / Self Care | Payer: BC Managed Care – PPO | Attending: Obstetrics and Gynecology | Admitting: Obstetrics and Gynecology

## 2023-01-13 ENCOUNTER — Encounter (HOSPITAL_BASED_OUTPATIENT_CLINIC_OR_DEPARTMENT_OTHER): Payer: Self-pay | Admitting: Obstetrics and Gynecology

## 2023-01-13 DIAGNOSIS — N8003 Adenomyosis of the uterus: Secondary | ICD-10-CM | POA: Diagnosis not present

## 2023-01-13 DIAGNOSIS — D259 Leiomyoma of uterus, unspecified: Secondary | ICD-10-CM | POA: Insufficient documentation

## 2023-01-13 DIAGNOSIS — N946 Dysmenorrhea, unspecified: Secondary | ICD-10-CM | POA: Insufficient documentation

## 2023-01-13 DIAGNOSIS — Z01818 Encounter for other preprocedural examination: Secondary | ICD-10-CM

## 2023-01-13 HISTORY — DX: Presence of spectacles and contact lenses: Z97.3

## 2023-01-13 HISTORY — DX: Pelvic and perineal pain: R10.2

## 2023-01-13 HISTORY — DX: Leiomyoma of uterus, unspecified: D25.9

## 2023-01-13 HISTORY — DX: Migraine, unspecified, not intractable, without status migrainosus: G43.909

## 2023-01-13 HISTORY — DX: Brachial plexus disorders: G54.0

## 2023-01-13 HISTORY — PX: LAPAROSCOPY: SHX197

## 2023-01-13 LAB — CBC
HCT: 39.5 % (ref 36.0–46.0)
Hemoglobin: 12.9 g/dL (ref 12.0–15.0)
MCH: 29.9 pg (ref 26.0–34.0)
MCHC: 32.7 g/dL (ref 30.0–36.0)
MCV: 91.4 fL (ref 80.0–100.0)
Platelets: 192 10*3/uL (ref 150–400)
RBC: 4.32 MIL/uL (ref 3.87–5.11)
RDW: 12.6 % (ref 11.5–15.5)
WBC: 3.6 10*3/uL — ABNORMAL LOW (ref 4.0–10.5)
nRBC: 0 % (ref 0.0–0.2)

## 2023-01-13 LAB — POCT PREGNANCY, URINE: Preg Test, Ur: NEGATIVE

## 2023-01-13 SURGERY — LAPAROSCOPY, DIAGNOSTIC
Anesthesia: General | Site: Abdomen

## 2023-01-13 MED ORDER — ONDANSETRON 4 MG PO TBDP
4.0000 mg | ORAL_TABLET | Freq: Once | ORAL | Status: AC
  Administered 2023-01-13: 4 mg via ORAL

## 2023-01-13 MED ORDER — ONDANSETRON HCL 4 MG/2ML IJ SOLN
INTRAMUSCULAR | Status: AC
  Filled 2023-01-13: qty 2

## 2023-01-13 MED ORDER — ONDANSETRON 4 MG PO TBDP
ORAL_TABLET | ORAL | Status: AC
  Filled 2023-01-13: qty 1

## 2023-01-13 MED ORDER — ROCURONIUM BROMIDE 10 MG/ML (PF) SYRINGE
PREFILLED_SYRINGE | INTRAVENOUS | Status: DC | PRN
  Administered 2023-01-13: 40 mg via INTRAVENOUS

## 2023-01-13 MED ORDER — SCOPOLAMINE 1 MG/3DAYS TD PT72
1.0000 | MEDICATED_PATCH | TRANSDERMAL | Status: DC
  Administered 2023-01-13: 1.5 mg via TRANSDERMAL

## 2023-01-13 MED ORDER — SODIUM CHLORIDE 0.9 % IR SOLN
Status: DC | PRN
  Administered 2023-01-13: 1000 mL

## 2023-01-13 MED ORDER — LIDOCAINE 2% (20 MG/ML) 5 ML SYRINGE
INTRAMUSCULAR | Status: DC | PRN
  Administered 2023-01-13: 80 mg via INTRAVENOUS

## 2023-01-13 MED ORDER — PROPOFOL 10 MG/ML IV BOLUS
INTRAVENOUS | Status: DC | PRN
  Administered 2023-01-13: 130 mg via INTRAVENOUS

## 2023-01-13 MED ORDER — HYDROMORPHONE HCL 1 MG/ML IJ SOLN
INTRAMUSCULAR | Status: AC
  Filled 2023-01-13: qty 1

## 2023-01-13 MED ORDER — LIDOCAINE HCL (PF) 2 % IJ SOLN
INTRAMUSCULAR | Status: AC
  Filled 2023-01-13: qty 5

## 2023-01-13 MED ORDER — MIDAZOLAM HCL 5 MG/5ML IJ SOLN
INTRAMUSCULAR | Status: DC | PRN
  Administered 2023-01-13: 2 mg via INTRAVENOUS

## 2023-01-13 MED ORDER — BUPIVACAINE HCL 0.25 % IJ SOLN
INTRAMUSCULAR | Status: DC | PRN
  Administered 2023-01-13: 8 mL

## 2023-01-13 MED ORDER — MIDAZOLAM HCL 2 MG/2ML IJ SOLN
INTRAMUSCULAR | Status: AC
  Filled 2023-01-13: qty 2

## 2023-01-13 MED ORDER — KETOROLAC TROMETHAMINE 30 MG/ML IJ SOLN: 30.0000 mg | Freq: Once | INTRAMUSCULAR | Status: DC | PRN

## 2023-01-13 MED ORDER — ROCURONIUM BROMIDE 10 MG/ML (PF) SYRINGE
PREFILLED_SYRINGE | INTRAVENOUS | Status: AC
  Filled 2023-01-13: qty 10

## 2023-01-13 MED ORDER — ACETAMINOPHEN 500 MG PO TABS
1000.0000 mg | ORAL_TABLET | Freq: Once | ORAL | Status: AC
  Administered 2023-01-13: 1000 mg via ORAL

## 2023-01-13 MED ORDER — ACETAMINOPHEN 500 MG PO TABS
ORAL_TABLET | ORAL | Status: AC
  Filled 2023-01-13: qty 2

## 2023-01-13 MED ORDER — OXYCODONE HCL 5 MG PO TABS: 5.0000 mg | ORAL_TABLET | Freq: Once | ORAL | Status: DC | PRN

## 2023-01-13 MED ORDER — 0.9 % SODIUM CHLORIDE (POUR BTL) OPTIME
TOPICAL | Status: DC | PRN
  Administered 2023-01-13: 500 mL

## 2023-01-13 MED ORDER — ONDANSETRON HCL 4 MG/2ML IJ SOLN
INTRAMUSCULAR | Status: DC | PRN
  Administered 2023-01-13: 4 mg via INTRAVENOUS

## 2023-01-13 MED ORDER — SUGAMMADEX SODIUM 200 MG/2ML IV SOLN
INTRAVENOUS | Status: DC | PRN
  Administered 2023-01-13: 125 mg via INTRAVENOUS

## 2023-01-13 MED ORDER — CEFAZOLIN SODIUM-DEXTROSE 2-4 GM/100ML-% IV SOLN
INTRAVENOUS | Status: AC
  Filled 2023-01-13: qty 100

## 2023-01-13 MED ORDER — DEXAMETHASONE SODIUM PHOSPHATE 10 MG/ML IJ SOLN
INTRAMUSCULAR | Status: DC | PRN
  Administered 2023-01-13: 10 mg via INTRAVENOUS

## 2023-01-13 MED ORDER — CEFAZOLIN SODIUM-DEXTROSE 2-4 GM/100ML-% IV SOLN
2.0000 g | INTRAVENOUS | Status: AC
  Administered 2023-01-13: 2 g via INTRAVENOUS

## 2023-01-13 MED ORDER — HYDROMORPHONE HCL 1 MG/ML IJ SOLN
0.2500 mg | INTRAMUSCULAR | Status: DC | PRN
  Administered 2023-01-13 (×2): 0.25 mg via INTRAVENOUS

## 2023-01-13 MED ORDER — ONDANSETRON HCL 4 MG/2ML IJ SOLN: 4.0000 mg | Freq: Once | INTRAMUSCULAR | Status: DC | PRN

## 2023-01-13 MED ORDER — DEXMEDETOMIDINE HCL IN NACL 80 MCG/20ML IV SOLN
INTRAVENOUS | Status: AC
  Filled 2023-01-13: qty 20

## 2023-01-13 MED ORDER — OXYCODONE HCL 5 MG/5ML PO SOLN: 5.0000 mg | Freq: Once | ORAL | Status: DC | PRN

## 2023-01-13 MED ORDER — FENTANYL CITRATE (PF) 100 MCG/2ML IJ SOLN
INTRAMUSCULAR | Status: AC
  Filled 2023-01-13: qty 2

## 2023-01-13 MED ORDER — DEXAMETHASONE SODIUM PHOSPHATE 10 MG/ML IJ SOLN
INTRAMUSCULAR | Status: AC
  Filled 2023-01-13: qty 1

## 2023-01-13 MED ORDER — LACTATED RINGERS IV SOLN: INTRAVENOUS | Status: DC

## 2023-01-13 MED ORDER — FENTANYL CITRATE (PF) 100 MCG/2ML IJ SOLN
INTRAMUSCULAR | Status: DC | PRN
  Administered 2023-01-13: 100 ug via INTRAVENOUS

## 2023-01-13 MED ORDER — PHENYLEPHRINE 80 MCG/ML (10ML) SYRINGE FOR IV PUSH (FOR BLOOD PRESSURE SUPPORT)
PREFILLED_SYRINGE | INTRAVENOUS | Status: AC
  Filled 2023-01-13: qty 10

## 2023-01-13 MED ORDER — POVIDONE-IODINE 10 % EX SWAB: 2.0000 | Freq: Once | CUTANEOUS | Status: DC

## 2023-01-13 MED ORDER — DEXMEDETOMIDINE HCL IN NACL 80 MCG/20ML IV SOLN
INTRAVENOUS | Status: DC | PRN
  Administered 2023-01-13: 12 ug via BUCCAL

## 2023-01-13 MED ORDER — HEMOSTATIC AGENTS (NO CHARGE) OPTIME
TOPICAL | Status: DC | PRN
  Administered 2023-01-13: 1 via TOPICAL

## 2023-01-13 MED ORDER — SCOPOLAMINE 1 MG/3DAYS TD PT72
MEDICATED_PATCH | TRANSDERMAL | Status: AC
  Filled 2023-01-13: qty 1

## 2023-01-13 MED ORDER — PROPOFOL 10 MG/ML IV BOLUS
INTRAVENOUS | Status: AC
  Filled 2023-01-13: qty 20

## 2023-01-13 SURGICAL SUPPLY — 51 items
ADH SKN CLS APL DERMABOND .7 (GAUZE/BANDAGES/DRESSINGS) ×2
APL SRG 38 LTWT LNG FL B (MISCELLANEOUS) ×2
APL SWBSTK 6 STRL LF DISP (MISCELLANEOUS)
APPLICATOR ARISTA FLEXITIP XL (MISCELLANEOUS) IMPLANT
APPLICATOR COTTON TIP 6 STRL (MISCELLANEOUS) IMPLANT
APPLICATOR COTTON TIP 6IN STRL (MISCELLANEOUS) IMPLANT
CATH ROBINSON RED A/P 16FR (CATHETERS) ×2 IMPLANT
COVER MAYO STAND STRL (DRAPES) ×2 IMPLANT
DERMABOND ADVANCED .7 DNX12 (GAUZE/BANDAGES/DRESSINGS) ×2 IMPLANT
DRAPE SURG IRRIG POUCH 19X23 (DRAPES) ×2 IMPLANT
DRSG COVADERM PLUS 2X2 (GAUZE/BANDAGES/DRESSINGS) IMPLANT
DURAPREP 26ML APPLICATOR (WOUND CARE) ×2 IMPLANT
GAUZE 4X4 16PLY ~~LOC~~+RFID DBL (SPONGE) ×4 IMPLANT
GLOVE BIO SURGEON STRL SZ7 (GLOVE) ×4 IMPLANT
GLOVE BIOGEL PI IND STRL 7.0 (GLOVE) IMPLANT
GLOVE BIOGEL PI IND STRL 7.5 (GLOVE) IMPLANT
GOWN STRL REUS W/TWL XL LVL3 (GOWN DISPOSABLE) ×2 IMPLANT
HEMOSTAT ARISTA ABSORB 3G PWDR (HEMOSTASIS) IMPLANT
IV NS 1000ML (IV SOLUTION) ×2
IV NS 1000ML BAXH (IV SOLUTION) IMPLANT
KIT PINK PAD W/HEAD ARE REST (MISCELLANEOUS) ×2
KIT PINK PAD W/HEAD ARM REST (MISCELLANEOUS) ×4 IMPLANT
KIT TURNOVER CYSTO (KITS) ×2 IMPLANT
NDL INSUFFLATION 14GA 120MM (NEEDLE) IMPLANT
NEEDLE INSUFFLATION 14GA 120MM (NEEDLE) ×2 IMPLANT
NS IRRIG 500ML POUR BTL (IV SOLUTION) ×2 IMPLANT
PACK LAPAROSCOPY BASIN (CUSTOM PROCEDURE TRAY) ×2 IMPLANT
PAD OB MATERNITY 4.3X12.25 (PERSONAL CARE ITEMS) ×2 IMPLANT
PAD PREP 24X48 CUFFED NSTRL (MISCELLANEOUS) ×2 IMPLANT
SCISSORS LAP 5X45 EPIX DISP (ENDOMECHANICALS) IMPLANT
SEALER TISSUE G2 CVD JAW 45CM (ENDOMECHANICALS) IMPLANT
SET IRRIG Y TYPE TUR BLADDER L (SET/KITS/TRAYS/PACK) IMPLANT
SET SUCTION IRRIG HYDROSURG (IRRIGATION / IRRIGATOR) IMPLANT
SET TUBE SMOKE EVAC HIGH FLOW (TUBING) ×2 IMPLANT
SLEEVE SCD COMPRESS KNEE MED (STOCKING) ×2 IMPLANT
SLEEVE Z-THREAD 5X100MM (TROCAR) IMPLANT
SUT VIC AB 3-0 PS2 18 (SUTURE) ×2
SUT VIC AB 3-0 PS2 18XBRD (SUTURE) ×2 IMPLANT
SUT VIC AB 4-0 PS2 18 (SUTURE) IMPLANT
SUT VICRYL 0 ENDOLOOP (SUTURE) IMPLANT
SUT VICRYL 0 UR6 27IN ABS (SUTURE) IMPLANT
SYS BAG RETRIEVAL 10MM (BASKET)
SYS RETRIEVAL 5MM INZII UNIV (BASKET) ×2
SYSTEM BAG RETRIEVAL 10MM (BASKET) IMPLANT
SYSTEM RETRIEVL 5MM INZII UNIV (BASKET) IMPLANT
TOWEL OR 17X24 6PK STRL BLUE (TOWEL DISPOSABLE) ×4 IMPLANT
TROCAR BALLN 12MMX100 BLUNT (TROCAR) IMPLANT
TROCAR Z-THREAD BLADED 11X100M (TROCAR) IMPLANT
TROCAR Z-THREAD BLADED 5X100MM (TROCAR) ×2 IMPLANT
WARMER LAPAROSCOPE (MISCELLANEOUS) ×2 IMPLANT
WATER STERILE IRR 500ML POUR (IV SOLUTION) ×2 IMPLANT

## 2023-01-13 NOTE — Discharge Instructions (Addendum)
No acetaminophen/Tylenol until after 1:00pm today if needed for pain.     Post Anesthesia Home Care Instructions  Activity: Get plenty of rest for the remainder of the day. A responsible individual must stay with you for 24 hours following the procedure.  For the next 24 hours, DO NOT: -Drive a car -Paediatric nurse -Drink alcoholic beverages -Take any medication unless instructed by your physician -Make any legal decisions or sign important papers.  Meals: Start with liquid foods such as gelatin or soup. Progress to regular foods as tolerated. Avoid greasy, spicy, heavy foods. If nausea and/or vomiting occur, drink only clear liquids until the nausea and/or vomiting subsides. Call your physician if vomiting continues.  Special Instructions/Symptoms: Your throat may feel dry or sore from the anesthesia or the breathing tube placed in your throat during surgery. If this causes discomfort, gargle with warm salt water. The discomfort should disappear within 24 hours.  If you had a scopolamine patch placed behind your ear for the management of post- operative nausea and/or vomiting:  1. The medication in the patch is effective for 72 hours, after which it should be removed.  Wrap patch in a tissue and discard in the trash. Wash hands thoroughly with soap and water. 2. You may remove the patch earlier than 72 hours if you experience unpleasant side effects which may include dry   DISCHARGE INSTRUCTIONS: Laparoscopy  The following instructions have been prepared to help you care for yourself upon your return home today.  Wound care:  Do not get the incision wet for the first 24 hours. The incision should be kept clean and dry.  The Band-Aids or dressings may be removed the day after surgery.  Should the incision become sore, red, and swollen after the first week, check with your doctor.  Personal hygiene:  Shower the day after your procedure.  Activity and limitations:  Do NOT  drive or operate any equipment today.  Do NOT lift anything more than 15 pounds for 2-3 weeks after surgery.  Do NOT rest in bed all day.  Walking is encouraged. Walk each day, starting slowly with 5-minute walks 3 or 4 times a day. Slowly increase the length of your walks.  Walk up and down stairs slowly.  Do NOT do strenuous activities, such as golfing, playing tennis, bowling, running, biking, weight lifting, gardening, mowing, or vacuuming for 2-4 weeks. Ask your doctor when it is okay to start.  Diet: Eat a light meal as desired this evening. You may resume your usual diet tomorrow.  Return to work: This is dependent on the type of work you do. For the most part you can return to a desk job within a week of surgery. If you are more active at work, please discuss this with your doctor.  What to expect after your surgery: You may have a slight burning sensation when you urinate on the first day. You may have a very small amount of blood in the urine. Expect to have a small amount of vaginal discharge/light bleeding for 1-2 weeks. It is not unusual to have abdominal soreness and bruising for up to 2 weeks. You may be tired and need more rest for about 1 week. You may experience shoulder pain for 24-72 hours. Lying flat in bed may relieve it.  Call your doctor for any of the following:  Develop a fever of 100.4 or greater  Inability to urinate 6 hours after discharge from hospital  Severe pain not relieved by  pain medications  Persistent of heavy bleeding at incision site  Redness or swelling around incision site after a week  Increasing nausea or vomiting mouth, dizziness or visual disturbances. 3. Avoid touching the patch. Wash your hands with soap and water after contact with the patch.

## 2023-01-13 NOTE — Anesthesia Procedure Notes (Signed)
Procedure Name: Intubation Date/Time: 01/13/2023 7:29 AM  Performed by: Jakhai Fant D, CRNAPre-anesthesia Checklist: Patient identified, Emergency Drugs available, Suction available and Patient being monitored Patient Re-evaluated:Patient Re-evaluated prior to induction Oxygen Delivery Method: Circle system utilized Preoxygenation: Pre-oxygenation with 100% oxygen Induction Type: IV induction Ventilation: Mask ventilation without difficulty Laryngoscope Size: Mac and 3 Grade View: Grade I Tube type: Oral Tube size: 7.0 mm Number of attempts: 1 Airway Equipment and Method: Stylet and Oral airway Placement Confirmation: ETT inserted through vocal cords under direct vision, positive ETCO2 and breath sounds checked- equal and bilateral Secured at: 21 cm Tube secured with: Tape Dental Injury: Teeth and Oropharynx as per pre-operative assessment

## 2023-01-13 NOTE — Hospital Course (Signed)
History and physical eHistory and physical exam unchangedxam unchangedHistory and physical exam unchanged

## 2023-01-13 NOTE — H&P (Signed)
History and physical exam unchangedHistory and physical exam unchanged

## 2023-01-13 NOTE — Anesthesia Postprocedure Evaluation (Signed)
Anesthesia Post Note  Patient: [name]  Procedure(s) Performed: LAPAROSCOPY DIAGNOSTIC (Abdomen) LAPAROSCOPIC MYOMECTOMY (Abdomen)     Patient location during evaluation: PACU Anesthesia Type: General Level of consciousness: awake and alert Pain management: pain level controlled Vital Signs Assessment: post-procedure vital signs reviewed and stable Respiratory status: spontaneous breathing, nonlabored ventilation, respiratory function stable and patient connected to nasal cannula oxygen Cardiovascular status: blood pressure returned to baseline and stable Postop Assessment: no apparent nausea or vomiting Anesthetic complications: no  No notable events documented.  Last Vitals:  Vitals:   01/13/23 0930 01/13/23 0945  BP: (!) 105/55 (!) 105/55  Pulse: 75 65  Resp: 11 (!) 22  Temp: 36.5 C   SpO2: 99% 96%    Last Pain:  Vitals:   01/13/23 0945  TempSrc:   PainSc: 4                  [place]et [place]

## 2023-01-13 NOTE — Transfer of Care (Signed)
Immediate Anesthesia Transfer of Care Note  Patient: [name]  Procedure(s) Performed: LAPAROSCOPY DIAGNOSTIC (Abdomen) LAPAROSCOPIC MYOMECTOMY (Abdomen)  Patient Location: PACU  Anesthesia Type:General  Level of Consciousness: awake, alert , and oriented  Airway & Oxygen Therapy: Patient Spontanous Breathing and Patient connected to nasal cannula oxygen  Post-op Assessment: Report given to RN and Post -op Vital signs reviewed and stable  Post vital signs: Reviewed and stable  Last Vitals:  Vitals Value Taken Time  BP 105/53 01/13/23 0857  Temp    Pulse 87 01/13/23 0858  Resp 15 01/13/23 0858  SpO2 100 % 01/13/23 0858  Vitals shown include unvalidated device data.  Last Pain:  Vitals:   01/13/23 0604  TempSrc: Oral  PainSc: 0-No pain      Patients Stated Pain Goal: 5 ([id number])  Complications: No notable events documented.

## 2023-01-13 NOTE — Op Note (Signed)
Laura, Rosales MEDICAL RECORD NO: [id number] ACCOUNT NO: [id number] DATE OF BIRTH: 04-14-1986 FACILITY: [facility] LOCATION: [facility] PHYSICIAN: Darlyn Chamber, MD  Operative Report   DATE OF PROCEDURE: 01/13/2023  PREOPERATIVE DIAGNOSIS:  Pelvic pain, dysmenorrhea.   POSTOPERATIVE DIAGNOSIS:  Pelvic pain, dysmenorrhea with evidence of a pedunculated fibroids.  PROCEDURE:  Diagnostic laparoscopy with removal of 2 pedunculated fibroids.  SURGEON:  Darlyn Chamber, MD.  ANESTHESIA:  General endotracheal.  ESTIMATED BLOOD LOSS:  Minimal.    PACKS AND DRAINS: None.    INTRAOPERATIVE BLOOD PLACED: None.  COMPLICATIONS:  None.  INDICATIONS:  Dictated in history and physical.  DESCRIPTION OF PROCEDURE:  The patient was taken to the OR and placed supine position.  After satisfactory level of general endotracheal anesthesia was obtained, she was placed in the dorsal lithotomy position using the Allen stirrups.  Perineum was  prepped out with Betadine.  A Hulka tenaculum was put in place and secured.  Bladder was emptied by in and out catheterization.  The umbilical area was prepped with DuraPrep and after a period of time, the patient was draped in sterile field.   Subumbilical incision was made with a knife and extended through the subcutaneous tissue subumbilical area was infiltrated with 1% Xylocaine.  An incision was made.  The Veress needle was introduced into abdominal cavity without difficulty.  The abdomen was insufflated  with approximately 3 liters of carbon dioxide.  At this point in time, the Veress needle was removed.  The operative laparoscope was introduced.  Visualization revealed no evidence of injury to adjacent organs.  A 5 mm trocar was put in place in  suprapubic area under direct visualization.  Visualization revealed the uterus to have 2 pedunculated fibroids from the right fundal area.  One smaller than the other.  Pictures were taken.  Tubes and ovaries were  unremarkable.  There is no active  endometriosis or signs of adhesions.  The appendix was difficult to visualize less than retrocecal.  Upper abdomen including liver tip, the gallbladder were clear.  We put in a third trocar in the left lower quadrant after visualization of the epigastric  vessels. At this point in time, the smaller fibroid was excised using the EnSeal.  We brought about hemostasis with the EnSeal and bipolar.  This was placed in the cul-de-sac.  Next, the larger fibroid was identified.  Two Endoloops were brought in and  used to secure the pedunculated base of the fibroid.  An incision was just made and the fibroid was stuck in the cul-de-sac.  No active bleeding was noted.  We used the Endo sacs to remove both fibroids, one to the subumbilical area, the other to the  left lower quadrant incision.  These were all sent to pathology.  We then looked back in.  We brought about hemostasis at the site with bipolar.  Then, we brought in EnSeal.  We brought in [place].  We secured that over the site.  There was no active  bleeding or signs of injury to adjacent organs.  The abdomen was deflated with carbon dioxide.  All trocars were removed.  Subumbilical fascia closed with figure-of-eight of 0 Vicryl.  Skin was closed with interrupted subcuticulars of 3-0 Vicryl.   Suprapubic incision was closed with Dermabond.  Hulka tenaculum was then removed.  Sponge, instrument, needle count was correct by circulating nurse x2.  The patient tolerated the procedure well and was extubated, was transferred to recovery room in good  condition.  [email or web address] D: 01/13/2023 8:51:36 am T: 01/13/2023 9:31:00 am  JOB: [id number]

## 2023-01-13 NOTE — Brief Op Note (Signed)
01/13/2023  8:59 AM  PATIENT:  [name]  37 y.o. female  PRE-OPERATIVE DIAGNOSIS:  PRELVIC PAIN  FIBROIDS  POST-OPERATIVE DIAGNOSIS:  PRELVIC PAIN FIBROIDS  PROCEDURE:  Procedure(s): LAPAROSCOPY DIAGNOSTIC (N/A) LAPAROSCOPIC MYOMECTOMY  SURGEON:  Surgeon(s) and Role:    * Lyrick Worland, MD - Primary  PHYSICIAN ASSISTANT:   ASSISTANTS: none   ANESTHESIA:   local and general  EBl minimal BLOOD ADMINISTERED:none  DRAINS: none   LOCAL MEDICATIONS USED:  XYLOCAINE   SPECIMEN:  Source of Specimen:  fibroids  DISPOSITION OF SPECIMEN:  PATHOLOGY  COUNTS:  YES  TOURNIQUET:  * No tourniquets in log *  DICTATION: .Other Dictation: Dictation Number [id number]  PLAN OF CARE: Discharge to home after PACU  PATIENT DISPOSITION:  PACU - hemodynamically stable.   Delay start of Pharmacological VTE agent (>24hrs) due to surgical blood loss or risk of bleeding: no

## 2023-01-14 ENCOUNTER — Encounter (HOSPITAL_BASED_OUTPATIENT_CLINIC_OR_DEPARTMENT_OTHER): Payer: Self-pay | Admitting: Obstetrics and Gynecology

## 2023-01-14 DIAGNOSIS — R300 Dysuria: Secondary | ICD-10-CM | POA: Diagnosis not present

## 2023-01-14 DIAGNOSIS — R319 Hematuria, unspecified: Secondary | ICD-10-CM | POA: Diagnosis not present

## 2023-01-14 LAB — SURGICAL PATHOLOGY

## 2023-06-24 DIAGNOSIS — H04123 Dry eye syndrome of bilateral lacrimal glands: Secondary | ICD-10-CM | POA: Diagnosis not present

## 2023-06-24 DIAGNOSIS — H16423 Pannus (corneal), bilateral: Secondary | ICD-10-CM | POA: Diagnosis not present

## 2023-06-24 DIAGNOSIS — H40013 Open angle with borderline findings, low risk, bilateral: Secondary | ICD-10-CM | POA: Diagnosis not present

## 2023-06-24 DIAGNOSIS — G43B0 Ophthalmoplegic migraine, not intractable: Secondary | ICD-10-CM | POA: Diagnosis not present

## 2023-06-24 DIAGNOSIS — H5213 Myopia, bilateral: Secondary | ICD-10-CM | POA: Diagnosis not present

## 2023-07-06 DIAGNOSIS — Z0000 Encounter for general adult medical examination without abnormal findings: Secondary | ICD-10-CM | POA: Diagnosis not present

## 2023-07-06 DIAGNOSIS — R5383 Other fatigue: Secondary | ICD-10-CM | POA: Diagnosis not present

## 2023-07-06 DIAGNOSIS — Z13220 Encounter for screening for lipoid disorders: Secondary | ICD-10-CM | POA: Diagnosis not present

## 2023-07-21 DIAGNOSIS — D649 Anemia, unspecified: Secondary | ICD-10-CM | POA: Diagnosis not present

## 2023-07-22 ENCOUNTER — Ambulatory Visit: Payer: BC Managed Care – PPO | Admitting: Neurology

## 2023-07-29 ENCOUNTER — Ambulatory Visit: Payer: BC Managed Care – PPO | Admitting: Neurology

## 2023-08-03 ENCOUNTER — Ambulatory Visit (INDEPENDENT_AMBULATORY_CARE_PROVIDER_SITE_OTHER): Payer: BC Managed Care – PPO | Admitting: Neurology

## 2023-08-03 ENCOUNTER — Encounter: Payer: Self-pay | Admitting: Neurology

## 2023-08-03 DIAGNOSIS — G43009 Migraine without aura, not intractable, without status migrainosus: Secondary | ICD-10-CM | POA: Diagnosis not present

## 2023-08-03 DIAGNOSIS — R110 Nausea: Secondary | ICD-10-CM | POA: Diagnosis not present

## 2023-08-03 MED ORDER — ONDANSETRON 4 MG PO TBDP: ORAL_TABLET | ORAL | 11 refills | Status: DC

## 2023-08-03 MED ORDER — ZOMIG 5 MG NA SOLN: 1.0000 | NASAL | 11 refills | Status: DC | PRN

## 2023-08-03 NOTE — Progress Notes (Signed)
PATIENT: [name] DOB: 18-Oct-1986  08/03/2023: been off the Ajovy since January and doing well without it, one migraine a month sometimes she doesn't eally use the zomig much. Nurtec did not work. Zomig works when she uses it but has side effects she is very cold and very sleepy but works quickly and tolerates. She got home from the beach and took tylenol and zofran. She prefers to keep on acute only. She had acupuncture and dry needling. For her cervical myofascial pain she has been to multiple physicians stable. She does the exercises at home and has equipment.   Patient complains of symptoms per HPI as well as the following symptoms: none . Pertinent negatives and positives per HPI. All others negative  Reviewed;     Latest Ref Rng & Units 01/13/2023    6:17 AM 06/05/2021   10:06 AM 12/09/2016    3:54 PM  CBC  WBC 4.0 - 10.5 K/uL 3.6  3.1  4.6   Hemoglobin 12.0 - 15.0 g/dL 19.1  47.8  29.5   Hematocrit 36.0 - 46.0 % 39.5  39.4  39.9   Platelets 150 - 400 K/uL 192  232  225       Latest Ref Rng & Units 06/05/2021   10:06 AM 12/09/2016    3:54 PM 03/25/2016   11:24 AM  CMP  Glucose 65 - 99 mg/dL 84  96  98   BUN 6 - 20 mg/dL 6  8  8    Creatinine 0.57 - 1.00 mg/dL 6.21  3.08  6.57   Sodium 134 - 144 mmol/L 142  141  140   Potassium 3.5 - 5.2 mmol/L 4.3  4.1  5.0   Chloride 96 - 106 mmol/L 104  106  103   CO2 20 - 29 mmol/L 22  28  21    Calcium 8.7 - 10.2 mg/dL 9.3  9.7  9.4   Total Protein 6.0 - 8.5 g/dL 7.0   7.2   Total Bilirubin 0.0 - 1.2 mg/dL 0.8   0.6   Alkaline Phos 44 - 121 IU/L 49   52   AST 0 - 40 IU/L 14   14   ALT 0 - 32 IU/L 11   11       01/21/2022 ALL: [name] is a 37 y.o. female here today for follow up for migraines. She continues Ajovy, Zomig and ondansetron. She reports migraines are well managed. She may have 1-2 per month. Easily aborted with Zomig. She is being followed for possible thoracic outlet syndrome. She plans to start PT soon. She is  seeing Dr Renaye Rakers, vascular surgery with [facility].   Observations/Objective:  Generalized: Well developed, in no acute distress  Mentation: Alert oriented to time, place, history taking. Follows all commands speech and language fluent  Exam: NAD, pleasant                  Speech:    Speech is normal; fluent and spontaneous with normal comprehension.  Cognition:    The patient is oriented to person, place, and time;     recent and remote memory intact;     language fluent;    Cranial Nerves:    The pupils are equal, round, and reactive to light.Trigeminal sensation is intact and the muscles of mastication are normal. The face is symmetric. The palate elevates in the midline. Hearing intact. Voice is normal. Shoulder shrug is normal. The tongue has normal motion without fasciculations.  Coordination:  No dysmetria  Motor Observation:    No asymmetry, no atrophy, and no involuntary movements noted. Tone:    Normal muscle tone.     Strength:    Strength is V/V in the upper and lower limbs.      Sensation: intact to LT  Assessment and Plan:  37 y.o. 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 here with    ICD-10-CM   1. Migraine without aura and without status migrainosus, not intractable  G43.009 ondansetron (ZOFRAN ODT) 4 MG disintegrating tablet    ZOMIG 5 MG nasal solution    2. Nausea without vomiting  R11.0 ondansetron (ZOFRAN ODT) 4 MG disintegrating tablet       been off the Ajovy since January and doing well without it, one migraine a month sometimes she doesn't eally use the zomig much. Nurtec did not work. Zomig works when she uses it but has side effects she is very cold and very sleepy but works quickly and tolerates. She got home from the beach and  took tylenol and zofran. She prefers to keep on acute only. She had acupuncture and dry needling. For her cervical myofascial pain she has been to multiple physicians stable. She does the exercises at home and has equipment.  She will follow up with amy in 1 year.    Meds ordered this encounter  Medications   ondansetron (ZOFRAN ODT) 4 MG disintegrating tablet    Sig: Take 1-2 tablet (4-8 mg total) by mouth every 8 (eight) hours as needed for nausea or vomiting.    Dispense:  20 tablet    Refill:  11   ZOMIG 5 MG nasal solution    Sig: Place 1 spray into the nose as needed for migraine. May repeat in 2 hours. Max 2x a day.    Dispense:  6 each    Refill:  11    I spent over 10 minutes of face-to-face and non-face-to-face time with patient on the  1. Migraine without aura and without status migrainosus, not intractable   2. Nausea without vomiting    diagnosis.  This included previsit chart review, lab review, study review, order entry, electronic health record documentation, patient education on the different diagnostic and therapeutic options, counseling and coordination of care, risks and benefits of management, compliance, or risk factor reduction    Anson Fret, MD

## 2023-08-11 DIAGNOSIS — D649 Anemia, unspecified: Secondary | ICD-10-CM | POA: Diagnosis not present

## 2023-09-10 ENCOUNTER — Other Ambulatory Visit: Payer: Self-pay | Admitting: Medical Genetics

## 2023-09-10 DIAGNOSIS — Z006 Encounter for examination for normal comparison and control in clinical research program: Secondary | ICD-10-CM

## 2023-10-05 DIAGNOSIS — S0500XA Injury of conjunctiva and corneal abrasion without foreign body, unspecified eye, initial encounter: Secondary | ICD-10-CM | POA: Diagnosis not present

## 2023-10-07 DIAGNOSIS — S0500XD Injury of conjunctiva and corneal abrasion without foreign body, unspecified eye, subsequent encounter: Secondary | ICD-10-CM | POA: Diagnosis not present

## 2023-11-19 DIAGNOSIS — Z124 Encounter for screening for malignant neoplasm of cervix: Secondary | ICD-10-CM | POA: Diagnosis not present

## 2023-11-19 DIAGNOSIS — Z1151 Encounter for screening for human papillomavirus (HPV): Secondary | ICD-10-CM | POA: Diagnosis not present

## 2023-11-19 DIAGNOSIS — Z01419 Encounter for gynecological examination (general) (routine) without abnormal findings: Secondary | ICD-10-CM | POA: Diagnosis not present

## 2023-11-19 DIAGNOSIS — Z6822 Body mass index (BMI) 22.0-22.9, adult: Secondary | ICD-10-CM | POA: Diagnosis not present

## 2023-12-11 DIAGNOSIS — H5711 Ocular pain, right eye: Secondary | ICD-10-CM | POA: Diagnosis not present

## 2023-12-11 DIAGNOSIS — S0501XA Injury of conjunctiva and corneal abrasion without foreign body, right eye, initial encounter: Secondary | ICD-10-CM | POA: Diagnosis not present

## 2023-12-15 DIAGNOSIS — S0501XA Injury of conjunctiva and corneal abrasion without foreign body, right eye, initial encounter: Secondary | ICD-10-CM | POA: Diagnosis not present

## 2024-01-05 ENCOUNTER — Other Ambulatory Visit: Payer: Self-pay | Admitting: Neurology

## 2024-01-05 ENCOUNTER — Encounter: Payer: Self-pay | Admitting: Neurology

## 2024-01-05 DIAGNOSIS — G43709 Chronic migraine without aura, not intractable, without status migrainosus: Secondary | ICD-10-CM

## 2024-01-05 MED ORDER — ZOLMITRIPTAN 5 MG PO TABS: 5.0000 mg | ORAL_TABLET | ORAL | 11 refills | Status: DC | PRN

## 2024-01-06 DIAGNOSIS — R5383 Other fatigue: Secondary | ICD-10-CM | POA: Diagnosis not present

## 2024-01-06 DIAGNOSIS — D649 Anemia, unspecified: Secondary | ICD-10-CM | POA: Diagnosis not present

## 2024-01-28 DIAGNOSIS — D649 Anemia, unspecified: Secondary | ICD-10-CM | POA: Diagnosis not present

## 2024-06-28 DIAGNOSIS — H04123 Dry eye syndrome of bilateral lacrimal glands: Secondary | ICD-10-CM | POA: Diagnosis not present

## 2024-06-28 DIAGNOSIS — H40013 Open angle with borderline findings, low risk, bilateral: Secondary | ICD-10-CM | POA: Diagnosis not present

## 2024-06-28 DIAGNOSIS — G43B0 Ophthalmoplegic migraine, not intractable: Secondary | ICD-10-CM | POA: Diagnosis not present

## 2024-06-28 DIAGNOSIS — H16423 Pannus (corneal), bilateral: Secondary | ICD-10-CM | POA: Diagnosis not present

## 2024-07-19 DIAGNOSIS — Z0000 Encounter for general adult medical examination without abnormal findings: Secondary | ICD-10-CM | POA: Diagnosis not present

## 2024-07-19 DIAGNOSIS — S46812A Strain of other muscles, fascia and tendons at shoulder and upper arm level, left arm, initial encounter: Secondary | ICD-10-CM | POA: Diagnosis not present

## 2024-07-19 DIAGNOSIS — Z13220 Encounter for screening for lipoid disorders: Secondary | ICD-10-CM | POA: Diagnosis not present

## 2024-07-19 DIAGNOSIS — L309 Dermatitis, unspecified: Secondary | ICD-10-CM | POA: Diagnosis not present

## 2024-07-22 NOTE — Progress Notes (Signed)
 PATIENT: [name] DOB: 04/21/1986  REASON FOR VISIT: follow up HISTORY FROM: patient  Chief Complaint  Patient presents with   Follow-up    Pt in room 1.alone. Here for migraine follow up.     HISTORY OF PRESENT ILLNESS:  07/25/24 ALL: Lakelynn returns for follow up for migraines. She stopped Ajovy  over a year ago. She feels she is doing fairly well. She has more headache days in the warmer months. May have 7-8 headache days with 5-6 being migrainous. Cooler months, she usually has 1-2. More around her menses. She can not take Aleve but ibuprofen helps. Zomig  and ondansetron  help. She tries not to take Zomig  due to neck tightness and feeling cold. Failed sumatriptan , rizatriptan, eletriptan  and Nurtec.   08/03/2023 AA:  been off the Ajovy  since January and doing well without it, one migraine a month sometimes she doesn't eally use the zomig  much. Nurtec did not work. Zomig  works when she uses it but has side effects she is very cold and very sleepy but works quickly and tolerates. She got home from the beach and took tylenol  and zofran . She prefers to keep on acute only. She had acupuncture and dry needling. For her cervical myofascial pain she has been to multiple physicians stable. She does the exercises at home and has equipment.   01/21/2022 ALL (Mychart): [name] is a 38 y.o. female here today for follow up for migraines. She continues Ajovy , Zomig  and ondansetron . She reports migraines are well managed. She may have 1-2 per month. Easily aborted with Zomig . She is being followed for possible thoracic outlet syndrome. She plans to start PT soon. She is seeing Dr Liza, vascular surgery with [facility].  06/05/2021 AA: HPI:  [name] is a 38 y.o. female here as requested by Scifres, Naomie, PA-C for right hand weakness.  Past medical history bronchospasm, GERD, leukopenia, migraine, gastritis, rosacea.  We have seen patient in the past for migraines today she is here for a  new chief complaint as requested by Dr. Lamar Needy office, right hand weakness,    Patient states she has right hand numbness and weakness, she has had problems since she was younger but has had more trouble since January 2022, getting harder to hold things, she has pain in her wrist which has improved however is mostly weak, sometimes the symptoms extend into her forearm, and the muscles feel tight in that area, and nerve study completed which did not show evidence of CTS. Started as a child, as a teenager she wrote a lot and she had weakness turning the ignition due to pain, she was given anti-inflammatories, would come ang for years, recently more weakness than pain, pain in the wrist a sharp pain, mostly she gets very tense and her arm is tired a lot the forarm, the elbow and shoulder is fine, mostly in the hands, all the fingers feel numb, worse after she uses it, she has some neckpain but no radicular symptoms, she has an emg in April and it was normal. No injuries. If she sleeps on it wrong she has pins and needles. Worse during her menses she has muscle pain all over. No neuromuscular disorders in the family. Several years ago she had left-sided neckpain and into her collar bone and her whole left arm went numb, she went to the ED, that slowly resolved. Prednisone helped. No temperature differences. No other focal neurologic deficits, associated symptoms, inciting events or modifiable factors.   I reviewed Dr.  Reed's and Naomie scifres' notes as well as orthopedics: She has right hand pain and weakness, she has a long history of right wrist and arm pain, she has noticed more weakness and muscle fatigue recently in the last few weeks, no change in activities,, she is right-handed and writes and types frequently for work, she notices more weakness when trying to grab something or hold onto something heavy, noting her forearm feels wobbly, she feels some numbness on the posterior hand occasionally, of  note she has had neck and shoulder pain off and on for the past 2 years, CT scan of the neck in 2018 was unremarkable, she currently denies neck pain.  As far as migraines go she continues to take Ajovy , diclofenac , and to [place], Tylenol , Zomig .  On examination she has full active range of motion, strength 5 out of 5, mild tenderness over the right posterior hand with soft tissue bulge at the carpal bones noted with wrist extension not present in left hand, no bony tenderness, sensation to light touch.  She was referred to orthopedics.  In the past as reviewing her records she is also had problems with left cervical radiculopathy in 2021.  In 2020 she also had back pain midline numb and tingly in that area I reviewed [facility] notes reporting right hand weakness numbness note from April 2022 and diagnosed with carpal tunnel syndrome and it appears she had an EMG, and they wanted her to speak with neurology about some of the abnormal muscle movements and the sensation she can getting in terms of the tightness in the arm, if her symptoms worsen they may consider diagnostic therapeutic injections.  She was advised to continue bracing there was referral to an EMG nerve conduction study, who stated it was normal, did not show carpal tunnel syndrome, follow-up with neurology.  As far as labs go, date of service April 12, 2020, CMP was unremarkable with creatinine 0.58, BUN 8, T bili 1.3, CBC showed slightly lowered white blood cells at 3.8 otherwise unremarkable, hep C was negative.   I reviewed EMG nerve conduction study data and report all nerve conduction studies and muscles examined were normal this included the right median ENT sensory, right radial antisensory, right ulnar antisensory, right median motor, right ulnar motor and right-sided muscles including: Biceps, deltoid, triceps, first dorsal interosseous, abductor pollicis brevis, pronator teres, brachioradialis, flexor pollicis longus all normal.  The right  median 3.6, 26.9 the right radial 2.3, 35.5, the right ulnar 3.4, 34.1, the right median 3.4 ms, 11.3 mV 56 m/s, right ulnar 34 ms, 12 mV, 61 and 67 m/s.  05/16/2020 ALL:  Francia Verry is a 38 y.o. female here today for follow up for migraines. She is doing well. She reports that headache frequency and intensity have significantly reduced since continuing Amovig. She was having some difficulty using the injector. She has not taken Amovig in the past two months. She would like to see how she does without the medication. She has about 5 headache days per month. 2-3 are migrainous. She has significant nausea with bad headaches. Zofran  helps. She has tried and failed sumatriptan  and Relpax . Zomig  helps but causes cold hand and significant fatigue.    HISTORY: (copied from my note on 05/16/2019)  Peter Daquila is a 38 y.o. female here today for follow up of migraines. She feels that she is doing well. Amovig is working without adverse effects. She reports about 4 migraines in April,4 in May, and 7 in June. Headaches  are mild. Zomig  helps. She has not taken Cambia  in a while due to being out of medication. She is interested in finding triggers. She has not documented a migraine journal. She does drink caffeine regularly.      HISTORY: (copied from my note on 02/14/2019)   Johneisha Broaden is a 38 y.o. female for follow up of migraines. She continues Amovig monthly and Zomig  for abortive therapy.  She reports that she continues to have 4-5 migraine days per month.  She has been thinking about adding another preventative therapy to reduce these migraines.  She feels that they most often occur while she is at work.  She has tried multiple medications in the past including topiramate  IR, propranolol , and nortriptyline .  She was advised to start Qudexy  at a previous visit but opted not to as Aimovig  did seem to work.  She does feel that Zomig  helps for abortive therapy.     History (copied from Dr Sharion note on  02/15/2018)   Interval history: She is on Aimovig  and doing well.  Since starting Aimovig , severity and frequency decreased, no severe headaches. And less headaches. Discussed options, daily meds, increase Aimovig . Continue acute management.    January: 5 migraine days and 4 headache days February: 4 migraine days March: 3 migraine days    Tried: Topiramate  IR and nortriptyline , propranolol , relpax , cambia , zofran , Aimovig , zomig , toradol  injections   Interval history 08/17/2017:  She uses zomig , works faster nasally, cambia  afterwards helps if more severe. In one hour the migraine has resolved. Zomig  and cambia  work best. She is getting both zomig  and cambia . Severity is better with Aimovig . No side effects. She had some numbness and tingling several times. Zofran  helps with nausea. Also helps with nausea on non-headache days. She has not started the Qudexy . Will wait and see how Aimovig  works. Will hold for now and wait and see. The migraines are not as intense, increased severity and frequency.    June 2 migraine days of headaches July 7 migraine days August 3 migraine days Sep 5 migraine days     Interval history 04/13/2017:  Patient is here for follow-up of migraines. She has failed multiple medications as below. She is using relpax  and zofran  for acute management and may also use a cambia  if needed. She did not tolerate propranolol . She has side effects to medications. With Nortriptyline  she did not sleep well and was anxious. The Toradol  shots work well. But no preventative has worked.  Will try Qudexy  and also Erenumab . She has 15 headache days a month and superimposed migraines as below. Discussed teratogenicity of topiramate  do not get pregnant. We'll see her back in 4 months. She is to email me so we can slowly increase the topiramate  extended release. Stop for any side effects or email me.   January: 5 migraines necessitating Relpax . February 2 migraines, March had 5 migraines and 6  headaches needing treatment of other kind, May 9 migraines necessitating relpax  and a few others where she took tylenol  or excedin. No medication overuse.   Tried: Topiramate  IR and nortriptyline (do not tolerate), propranolol (did not tolerate), relpax (works), zofran , cambia , Zomig  nasal spray (worked well)   Interval history 11/19/2016:  She has had 2 headaches with vision changes in the right eye and was seen at the ED for severe headache. Relpax  helps along with cambia . She has 10 headache days a month and the following number of migraines(below). Mother is here who also has similar headaches. We discussed medications, she is  toelrating propranolol  well withh slowly increase. Discussed triptan's, Relpax  appears to work but may wear off advised her to try Relpax  with Cambia  and zofran  and also discussed Zomig  (provided samples of nasal spray) and other triptan's and different ways to administer including nose sprays and powders and injectables. Also discussed Cephaly device and other devices available on the market. Migraines: September 2  October 4 Nov 3 Dec 4   Tried: Topiramate  and nortriptyline , now on propranolol    Interval history 07/09/2016: Imitrex  not entirely working and  Makes neck stiff, will try Relpax  next. Weekends are worse. In June had 4 headaches days ranging from 4-6/10 in pain. In July she had 4 mild headaches 2-4/10, moderate 2 6-8/10 and had one very severe. They slowly progress but can get bad. She has had 4 in August one 7/10. She feels dehydration makes them worse. She has difficulty sleeping throughout the night. She has tried imitrex  and ibuprofen and tylenol . She has a lot nausea and pills make her sick and difficulty swallowing. Discussed starting a daily preventative and also options for acute management. We'll start nortriptyline  at night for prevention. We'll provide Relpax , Zofran  and can be for acute management. Patient is to see me back in 4 months.     HPI:  Nickole Adamek is a 38 y.o. female here as a referral from Dr. McComb for headaches. Past medical history of asthma, Headaches started for most of her life since high school. Worsening over the last 5-6 years. They are more intense, has to lay down. Usually around the eyes, mostly around the temples. Usually more unilateral then switches to the other side. Throbbing behind the eyes. Light sensitivity, shuts the blinds, lays down with an ice pack. +nausea but no vomiting. She has them on the weekends. 10 headache days a month.  2-3 are migraines. Migraines last the whole day. Gradually gets stronger over a few hours, can get to a 7/10 in pain at its worst on average 3-5/10. Last all day, up to 24 hours. No aura. No dizziness or vertigo or weakness or sensory changes. Mother, aunt, grandmother with migraines. No plans to get pregnant. No medication overuse. Takes Excedrin occ. Or Tylenol  but no more than a few times a week. The non-migrainous headaches last just a few hours and they are dull and not too painful.  No blurry vision. No aura. No family history of headaches.   Reviewed notes, labs and imaging from outside physicians, which showed: Reviewed notes from OB/GYN. She was started on birth control pills for management of abnormal bleeding, her regulars were regular but she was having midcycle spotting. No significant dysmenorrhea. Reported she had problems on and up with headaches for some time that it never been evaluated. Not sexually active. No urinary or bowel complaints. Exam was negative, thyroid  nonpalpable, breasts normal, lungs clear, regular rate and rhythm without murmurs or gallop, no carotid or abdominal bruits, abdominal exam benign, no masses or organomegaly or tenderness, normal external genitalia, cervix unremarkable, uterus and adnexa normal.    REVIEW OF SYSTEMS: Out of a complete 14 system review of symptoms, the patient complains only of the following symptoms, headaches, anxiety and all other  reviewed systems are negative.   ALLERGIES: Allergies  Allergen Reactions   Honey Bee Venom Protein [Honey Bee Venom]     Other reaction(s): Edema   Meloxicam Other (See Comments)    Stomach cramps   Sulfa Antibiotics Hives    HOME MEDICATIONS: Outpatient Medications Prior to  Visit  Medication Sig Dispense Refill   acetaminophen  (TYLENOL ) 500 MG tablet Take 500 mg by mouth every 6 (six) hours as needed for headache.     ibuprofen (ADVIL) 200 MG tablet Take 200 mg by mouth every 6 (six) hours as needed for fever.     ondansetron  (ZOFRAN  ODT) 4 MG disintegrating tablet Take 1-2 tablet (4-8 mg total) by mouth every 8 (eight) hours as needed for nausea or vomiting. 20 tablet 11   zolmitriptan  (ZOMIG ) 5 MG tablet Take 1 tablet (5 mg total) by mouth as needed for migraine. May rpeat in 2 hours. 12 tablet 11   omeprazole (PRILOSEC) 20 MG capsule Take 20 mg by mouth as needed.     No facility-administered medications prior to visit.    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 freichlag;  per note in epic mild to moderate neurogenic on right with an arterial component,  physical therapy treatment,  per pt symptoms intermittant   Pelvic pain    Uterine fibroid    Vitreous detachment of right eye 2016   Wears contact lenses     PAST SURGICAL HISTORY: Past Surgical History:  Procedure Laterality Date   ESOPHAGOGASTRODUODENOSCOPY (EGD) WITH PROPOFOL   2017   LAPAROSCOPY N/A 01/13/2023   Procedure: LAPAROSCOPY DIAGNOSTIC;  Surgeon: Leva Rush, MD;  Location: [facility] [place];  Service: Gynecology;  Laterality: N/A;   WISDOM TOOTH EXTRACTION  2004   x4    FAMILY HISTORY: Family History  Problem Relation Age of Onset   High blood pressure Mother    High blood pressure  Father    Diabetes Mellitus II Maternal Grandmother    Diabetes Mellitus I Maternal Grandfather    Lung cancer Maternal Grandfather    Stroke Paternal Grandfather    Stroke Paternal Grandmother    Breast cancer Paternal Aunt     SOCIAL HISTORY: Social History   Socioeconomic History   Marital status: Single    Spouse name: Not on file   Number of children: 0   Years of education: 16   Highest education level: Not on file  Occupational History   Occupation: [employer]  Tobacco Use   Smoking status: Never   Smokeless tobacco: Never  Vaping Use   Vaping status: Never Used  Substance and Sexual Activity   Alcohol use: No    Alcohol/week: 0.0 standard drinks of alcohol   Drug use: Never   Sexual activity: Not on file  Other Topics Concern   Not on file  Social History Narrative   Lives with mother and stepfather   Caffeine use: 1 coffee/day   1 soda per day   Right handed   Social Drivers of Corporate investment banker Strain: Not on file  Food Insecurity: Not on file  Transportation Needs: Not on file  Physical Activity: Not on file  Stress: Not on file  Social Connections: Not on file  Intimate Partner Violence: Not on file      PHYSICAL EXAM  Vitals:   07/25/24 1021  BP: 120/87  Pulse: 83  SpO2: 100%  Weight: 127 lb 8 oz (57.8 kg)  Height: 5' 2 (1.575 m)    Body mass index is 23.32 kg/m.  Generalized: Well developed, in no acute  distress  Neurological examination  Mentation: Alert oriented to time, place, history taking. Follows all commands speech and language fluent Cranial nerve II-XII: Pupils were equal round reactive to light. Extraocular movements were full, visual field were full  Motor: The motor testing reveals 5 over 5 strength of all 4 extremities. Good symmetric motor tone is noted throughout.  Gait and station: Gait is normal.   DIAGNOSTIC DATA (LABS, IMAGING, TESTING) - I reviewed patient records, labs, notes, testing  and imaging myself where available.      No data to display           Lab Results  Component Value Date   WBC 3.6 (L) 01/13/2023   HGB 12.9 01/13/2023   HCT 39.5 01/13/2023   MCV 91.4 01/13/2023   PLT 192 01/13/2023      Component Value Date/Time   NA 142 06/05/2021 1006   K 4.3 06/05/2021 1006   CL 104 06/05/2021 1006   CO2 22 06/05/2021 1006   GLUCOSE 84 06/05/2021 1006   GLUCOSE 96 12/09/2016 1554   BUN 6 06/05/2021 1006   CREATININE 0.68 06/05/2021 1006   CALCIUM 9.3 06/05/2021 1006   PROT 7.0 06/05/2021 1006   ALBUMIN 4.7 06/05/2021 1006   AST 14 06/05/2021 1006   ALT 11 06/05/2021 1006   ALKPHOS 49 06/05/2021 1006   BILITOT 0.8 06/05/2021 1006   GFRNONAA >60 12/09/2016 1554   GFRAA >60 12/09/2016 1554   No results found for: CHOL, HDL, LDLCALC, LDLDIRECT, TRIG, CHOLHDL No results found for: YHAJ8R No results found for: VITAMINB12 No results found for: TSH     ASSESSMENT AND PLAN 38 y.o. 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 here with     ICD-10-CM   1. Chronic migraine without aura without status migrainosus, not intractable  G43.709 zolmitriptan  (ZOMIG ) 5 MG tablet    2. Migraine without aura and without status migrainosus, not intractable  G43.009 ondansetron  (ZOFRAN  ODT) 4 MG disintegrating tablet    3. Nausea without vomiting  R11.0 ondansetron  (ZOFRAN  ODT) 4 MG disintegrating tablet       Tichina is doing well today. She will continue Zomig  and ondansetron  as needed. Will give her samples of Ubrelvy  due to side effects with Zomig . I have educated her on appropriate administration and possible side effects. She was advised not to use abortive medications regularly. Will consider magnesium and CoQ10 supplements  if needed. Healthy lifestyle habits encouraged. She will follow up in 1 year, sooner if needed. She verbalizes understanding and agreement with this plan.    No orders of the defined types were placed in this encounter.    Meds ordered this encounter  Medications   ondansetron  (ZOFRAN  ODT) 4 MG disintegrating tablet    Sig: Take 1-2 tablet (4-8 mg total) by mouth every 8 (eight) hours as needed for nausea or vomiting.    Dispense:  20 tablet    Refill:  11    Supervising Provider:   AHERN, ANTONIA B [[id number]]   zolmitriptan  (ZOMIG ) 5 MG tablet    Sig: Take 1 tablet (5 mg total) by mouth as needed for migraine. May rpeat in 2 hours.    Dispense:  12 tablet    Refill:  11    Supervising Provider:   INES ONETHA NOVAK [[id number]]  I personally spent a total of 30 minutes in the care of the patient today including preparing to see the patient, getting/reviewing separately obtained history, performing a medically appropriate exam/evaluation, counseling and educating, placing orders, and documenting clinical information in the EHR.   Greig Forbes, FNP-C 07/25/2024, 10:49 AM [facility] [address] [place], [place] [address] [phone]

## 2024-07-22 NOTE — Patient Instructions (Signed)
 Below is our plan:  We will continue Zomig  and ondansetron  as needed. Try Ubrelvy . If it helps let me know and we can send a prescription.   Please make sure you are staying well hydrated. I recommend 50-60 ounces daily. Well balanced diet and regular exercise encouraged. Consistent sleep schedule with 6-8 hours recommended.   Please continue follow up with care team as directed.   Follow up with me in 1 year   You may receive a survey regarding today's visit. I encourage you to leave honest feed back as I do use this information to improve patient care. Thank you for seeing me today!   GENERAL HEADACHE INFORMATION:   Natural supplements: Magnesium Oxide or Magnesium Glycinate 500 mg at bed (up to 800 mg daily) Coenzyme Q10 300 mg in AM Vitamin B2- 200 mg twice a day   Add 1 supplement at a time since even natural supplements can have undesirable side effects. You can sometimes buy supplements cheaper (especially Coenzyme Q10) at www.WebmailGuide.co.za or at [facility].  Migraine with aura: There is increased risk for stroke in women with migraine with aura and a contraindication for the combined contraceptive pill for use by women who have migraine with aura. The risk for women with migraine without aura is lower. However other risk factors like smoking are far more likely to increase stroke risk than migraine. There is a recommendation for no smoking and for the use of OCPs without estrogen such as progestogen only pills particularly for women with migraine with aura.SABRA People who have migraine headaches with auras may be 3 times more likely to have a stroke caused by a blood clot, compared to migraine patients who don't see auras. Women who take hormone-replacement therapy may be 30 percent more likely to suffer a clot-based stroke than women not taking medication containing estrogen. Other risk factors like smoking and high blood pressure may be  much more important.    Vitamins and herbs that show  potential:   Magnesium: Magnesium (250 mg twice a day or 500 mg at bed) has a relaxant effect on smooth muscles such as blood vessels. Individuals suffering from frequent or daily headache usually have low magnesium levels which can be increase with daily supplementation of 400-750 mg. Three trials found 40-90% average headache reduction  when used as a preventative. Magnesium may help with headaches are aura, the best evidence for magnesium is for migraine with aura is its thought to stop the cortical spreading depression we believe is the pathophysiology of migraine aura.Magnesium also demonstrated the benefit in menstrually related migraine.  Magnesium is part of the messenger system in the serotonin cascade and it is a good muscle relaxant.  It is also useful for constipation which can be a side effect of other medications used to treat migraine. Good sources include nuts, whole grains, and tomatoes. Side Effects: loose stool/diarrhea  Riboflavin (vitamin B 2) 200 mg twice a day. This vitamin assists nerve cells in the production of ATP a principal energy storing molecule.  It is necessary for many chemical reactions in the body.  There have been at least 3 clinical trials of riboflavin using 400 mg per day all of which suggested that migraine frequency can be decreased.  All 3 trials showed significant improvement in over half of migraine sufferers.  The supplement is found in bread, cereal, milk, meat, and poultry.  Most Americans get more riboflavin than the recommended daily allowance, however riboflavin deficiency is not necessary for the  supplements to help prevent headache. Side effects: energizing, green urine   Coenzyme Q10: This is present in almost all cells in the body and is critical component for the conversion of energy.  Recent studies have shown that a nutritional supplement of CoQ10 can reduce the frequency of migraine attacks by improving the energy production of cells as with  riboflavin.  Doses of 150 mg twice a day have been shown to be effective.   Melatonin: Increasing evidence shows correlation between melatonin secretion and headache conditions.  Melatonin supplementation has decreased headache intensity and duration.  It is widely used as a sleep aid.  Sleep is natures way of dealing with migraine.  A dose of 3 mg is recommended to start for headaches including cluster headache. Higher doses up to 15 mg has been reviewed for use in Cluster headache and have been used. The rationale behind using melatonin for cluster is that many theories regarding the cause of Cluster headache center around the disruption of the normal circadian rhythm in the brain.  This helps restore the normal circadian rhythm.   HEADACHE DIET: Foods and beverages which may trigger migraine Note that only 20% of headache patients are food sensitive. You will know if you are food sensitive if you get a headache consistently 20 minutes to 2 hours after eating a certain food. Only cut out a food if it causes headaches, otherwise you might remove foods you enjoy! What matters most for diet is to eat a well balanced healthy diet full of vegetables and low fat protein, and to not miss meals.   Chocolate, other sweets ALL cheeses except cottage and cream cheese Dairy products, yogurt, sour cream, ice cream Liver Meat extracts (Bovril, Marmite, meat tenderizers) Meats or fish which have undergone aging, fermenting, pickling or smoking. These include: Hotdogs,salami,Lox,sausage, mortadellas,smoked salmon, pepperoni, Pickled herring Pods of broad bean (English beans, Chinese pea pods, [place] (fava) beans, lima and navy beans Ripe avocado, ripe banana Yeast extracts or active yeast preparations such as Brewer's or Fleishman's (commercial bakes goods are permitted) Tomato based foods, pizza (lasagna, etc.)   MSG (monosodium glutamate) is disguised as many things; look for these common  aliases: Monopotassium glutamate Autolysed yeast Hydrolysed protein Sodium caseinate "flavorings" "all natural preservatives Nutrasweet   Avoid all other foods that convincingly provoke headaches.   Resources: The Dizzy Bluford Aid Your Headache Diet, migrainestrong.com  https://zamora-andrews.com/   Caffeine and Migraine For patients that have migraine, caffeine intake more than 3 days per week can lead to dependency and increased migraine frequency. I would recommend cutting back on your caffeine intake as best you can. The recommended amount of caffeine is 200-300 mg daily, although migraine patients may experience dependency at even lower doses. While you may notice an increase in headache temporarily, cutting back will be helpful for headaches in the long run. For more information on caffeine and migraine, visit: https://americanmigrainefoundation.org/resource-library/caffeine-and-migraine/   Headache Prevention Strategies:   1. Maintain a headache diary; learn to identify and avoid triggers.  - This can be a simple note where you log when you had a headache, associated symptoms, and medications used - There are several smartphone apps developed to help track migraines: Migraine Buddy, Migraine Monitor, Curelator N1-Headache App   Common triggers include: Emotional triggers: Emotional/Upset family or friends Emotional/Upset occupation Business reversal/success Anticipation anxiety Crisis-serious Post-crisis periodNew job/position   Physical triggers: Vacation Day Weekend Strenuous Exercise High Altitude Location New Move Menstrual Day Physical Illness Oversleep/Not enough sleep Weather changes Light:  Photophobia or light sesnitivity treatment involves a balance between desensitization and reduction in overly strong input. Use dark polarized glasses outside, but not inside. Avoid bright or fluorescent light, but do not dim  environment to the point that going into a normally lit room hurts. Consider FL-41 tint lenses, which reduce the most irritating wavelengths without blocking too much light.  These can be obtained at axonoptics.com or theraspecs.com Foods: see list above.   2. Limit use of acute treatments (over-the-counter medications, triptans, etc.) to no more than 2 days per week or 10 days per month to prevent medication overuse headache (rebound headache).     3. Follow a regular schedule (including weekends and holidays): Don't skip meals. Eat a balanced diet. 8 hours of sleep nightly. Minimize stress. Exercise 30 minutes per day. Being overweight is associated with a 5 times increased risk of chronic migraine. Keep well hydrated and drink 6-8 glasses of water per day.   4. Initiate non-pharmacologic measures at the earliest onset of your headache. Rest and quiet environment. Relax and reduce stress. Breathe2Relax is a free app that can instruct you on    some simple relaxtion and breathing techniques. Http://Dawnbuse.com is a    free website that provides teaching videos on relaxation.  Also, there are  many apps that   can be downloaded for "mindful" relaxation.  An app called YOGA NIDRA will help walk you through mindfulness. Another app called Calm can be downloaded to give you a structured mindfulness guide with daily reminders and skill development. Headspace for guided meditation Mindfulness Based Stress Reduction Online Course: www.palousemindfulness.com Cold compresses.   5. Don't wait!! Take the maximum allowable dosage of prescribed medication at the first sign of migraine.   6. Compliance:  Take prescribed medication regularly as directed and at the first sign of a migraine.   7. Communicate:  Call your physician when problems arise, especially if your headaches change, increase in frequency/severity, or become associated with neurological symptoms (weakness, numbness, slurred speech, etc.).  Proceed to emergency room if you experience new or worsening symptoms or symptoms do not resolve, if you have new neurologic symptoms or if headache is severe, or for any concerning symptom.   8. Headache/pain management therapies: Consider various complementary methods, including medication, behavioral therapy, psychological counselling, biofeedback, massage therapy, acupuncture, dry needling, and other modalities.  Such measures may reduce the need for medications. Counseling for pain management, where patients learn to function and ignore/minimize their pain, seems to work very well.   9. Recommend changing family's attention and focus away from patient's headaches. Instead, emphasize daily activities. If first question of day is 'How are your headaches/Do you have a headache today?', then patient will constantly think about headaches, thus making them worse. Goal is to re-direct attention away from headaches, toward daily activities and other distractions.   10. Helpful Websites: www.AmericanHeadacheSociety.org PatentHood.ch www.headaches.org TightMarket.nl www.achenet.org

## 2024-07-25 ENCOUNTER — Ambulatory Visit (INDEPENDENT_AMBULATORY_CARE_PROVIDER_SITE_OTHER): Admitting: Family Medicine

## 2024-07-25 ENCOUNTER — Encounter: Payer: Self-pay | Admitting: Family Medicine

## 2024-07-25 VITALS — BP 120/87 | HR 83 | Ht 62.0 in | Wt 127.5 lb

## 2024-07-25 DIAGNOSIS — G43709 Chronic migraine without aura, not intractable, without status migrainosus: Secondary | ICD-10-CM | POA: Diagnosis not present

## 2024-07-25 DIAGNOSIS — G43009 Migraine without aura, not intractable, without status migrainosus: Secondary | ICD-10-CM

## 2024-07-25 DIAGNOSIS — R110 Nausea: Secondary | ICD-10-CM | POA: Diagnosis not present

## 2024-07-25 MED ORDER — ZOLMITRIPTAN 5 MG PO TABS: 5.0000 mg | ORAL_TABLET | ORAL | 11 refills | Status: AC | PRN

## 2024-07-25 MED ORDER — ONDANSETRON 4 MG PO TBDP: ORAL_TABLET | ORAL | 11 refills | Status: AC

## 2024-08-09 ENCOUNTER — Ambulatory Visit: Payer: BC Managed Care – PPO | Admitting: Family Medicine

## 2024-08-20 ENCOUNTER — Other Ambulatory Visit: Payer: Self-pay | Admitting: Medical Genetics

## 2024-08-20 DIAGNOSIS — Z006 Encounter for examination for normal comparison and control in clinical research program: Secondary | ICD-10-CM

## 2024-11-14 ENCOUNTER — Encounter: Payer: Self-pay | Admitting: Family Medicine

## 2024-11-15 MED ORDER — UBRELVY 100 MG PO TABS: 100.0000 mg | ORAL_TABLET | Freq: Every day | ORAL | 11 refills | Status: AC | PRN

## 2024-11-18 ENCOUNTER — Telehealth: Payer: Self-pay

## 2024-11-18 ENCOUNTER — Other Ambulatory Visit (HOSPITAL_COMMUNITY): Payer: Self-pay

## 2024-11-18 NOTE — Telephone Encounter (Signed)
 Pharmacy Patient Advocate Encounter  Received notification from [facility] that Prior Authorization for Ubrelvy  has been APPROVED from 11/18/2024 to 11/18/2025. Ran test claim, Copay is $0. This test claim was processed through [facility]- copay amounts may vary at other pharmacies due to pharmacy/plan contracts, or as the patient moves through the different stages of their insurance plan.   PA #/Case ID/Reference #: [id number]

## 2024-11-18 NOTE — Telephone Encounter (Signed)
 Pharmacy Patient Advocate Encounter   Received notification from Patient Advice Request messages that prior authorization for Ubrelvy  is required/requested.   Insurance verification completed.   The patient is insured through [facility].   Per test claim: PA required; PA submitted to above mentioned insurance via Latent Key/confirmation #/EOC [id number] Status is pending

## 2024-12-04 LAB — GENECONNECT MOLECULAR SCREEN: Genetic Analysis Overall Interpretation: NEGATIVE

## 2025-01-25 ENCOUNTER — Ambulatory Visit: Admitting: Family Medicine

## 2025-08-02 ENCOUNTER — Ambulatory Visit: Admitting: Family Medicine

## 2025-08-07 ENCOUNTER — Ambulatory Visit: Admitting: Family Medicine
